# Patient Record
Sex: Male | Born: 1985 | Race: Black or African American | Hispanic: No | Smoking: Current every day smoker
Health system: Southern US, Community
[De-identification: ages and names within clinical notes are randomized; demographics above are authoritative.]

---
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
## 2007-01-04 HISTORY — PX: ORBITAL FRACTURE SURGERY: SHX725

## 2007-08-24 ENCOUNTER — Emergency Department (HOSPITAL_BASED_OUTPATIENT_CLINIC_OR_DEPARTMENT_OTHER): Admission: EM | Admit: 2007-08-24 | Discharge: 2007-08-25 | Payer: Self-pay | Admitting: Emergency Medicine

## 2007-09-14 ENCOUNTER — Ambulatory Visit (HOSPITAL_COMMUNITY): Admission: RE | Admit: 2007-09-14 | Discharge: 2007-09-15 | Payer: Self-pay | Admitting: Otolaryngology
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
## 2010-10-06 LAB — BASIC METABOLIC PANEL
BUN: 7
Chloride: 102
GFR calc non Af Amer: >60
Potassium: 4.9

## 2010-10-06 LAB — CBC
HCT: 50.4
Hemoglobin: 17.3 — ABNORMAL HIGH
MCHC: 34.3
MCV: 94.7
Platelets: 210
RBC: 5.33
RDW: 12.1

## 2019-06-02 ENCOUNTER — Ambulatory Visit (HOSPITAL_COMMUNITY)
Admission: EM | Admit: 2019-06-02 | Discharge: 2019-06-02 | Disposition: A | Discharge status: Home / Self Care | Payer: Self-pay | Attending: Family Medicine | Admitting: Family Medicine

## 2019-06-02 ENCOUNTER — Other Ambulatory Visit: Payer: Self-pay

## 2019-06-02 ENCOUNTER — Encounter (HOSPITAL_COMMUNITY): Payer: Self-pay

## 2019-06-02 DIAGNOSIS — S61412A Laceration without foreign body of left hand, initial encounter: Secondary | ICD-10-CM

## 2019-06-02 MED ORDER — AMOXICILLIN-POT CLAVULANATE 875-125 MG PO TABS: 1.0000 | ORAL_TABLET | Freq: Two times a day (BID) | ORAL | 0 refills | Status: DC

## 2019-06-02 NOTE — Discharge Instructions (Addendum)
We have placed 3 stitches in your hand.  He can come back here in around 10 days for removal. I am going to have him place you on antibiotics to hopefully prevent any infection to occur. Keep the hand clean and dry as possible.  Can wash with normal soap and water just pat dry.  Do not put any ointment on the hand this will delay healing Follow up as needed for continued or worsening symptoms

## 2019-06-02 NOTE — ED Provider Notes (Addendum)
[facility]    CSN: [id number] Arrival date & time: 06/02/19  1407      History   Chief Complaint Chief Complaint  Patient presents with  . Laceration    HPI [name] is a 34 y.o. male.   Patient is a 34 year old male presents today for laceration to left hand.  This occurred last evening.  He also has small superficial laceration and bruising to the left eye.  Reporting was trying to clean of the kitchen and hit eye on the corner of a cabinet.  Denies any trouble with vision, pain in the eye. Small superficial laceration next to larger laceration in between the pointer and middle finger.  Mild swelling generalized around the area.  Normal range of motion.  Normal sensation.  ROS per HPI      History reviewed. No pertinent past medical history.  There are no problems to display for this patient.   History reviewed. No pertinent surgical history.     Home Medications    Prior to Admission medications   Medication Sig Start Date End Date Taking? Authorizing Provider  amoxicillin-clavulanate (AUGMENTIN) 875-125 MG tablet Take 1 tablet by mouth every 12 (twelve) hours. 06/02/19   Orvan July, NP    Family History No family history on file.  Social History Social History   Tobacco Use  . Smoking status: Never Smoker  . Smokeless tobacco: Never Used  Substance Use Topics  . Alcohol use: Not Currently  . Drug use: Not on file     Allergies   Fish allergy   Review of Systems Review of Systems   Physical Exam Triage Vital Signs ED Triage Vitals  Enc Vitals Group     BP 06/02/19 1435 118/67     Pulse Rate 06/02/19 1435 71     Resp 06/02/19 1435 16     Temp 06/02/19 1435 98.7 F (37.1 C)     Temp Source 06/02/19 1435 Oral     SpO2 06/02/19 1435 100 %     Weight --      Height --      Head Circumference --      Peak Flow --      Pain Score 06/02/19 1432 3     Pain Loc --      Pain Edu? --      Excl. in [place]? --    No data  found.  Updated Vital Signs BP 118/67 (BP Location: Left Arm)   Pulse 71   Temp 98.7 F (37.1 C) (Oral)   Resp 16   SpO2 100%   Visual Acuity Right Eye Distance:   Left Eye Distance:   Bilateral Distance:    Right Eye Near:   Left Eye Near:    Bilateral Near:     Physical Exam Vitals and nursing note reviewed.  Constitutional:      Appearance: Normal appearance.  HENT:     Head: Normocephalic and atraumatic.     Nose: Nose normal.  Eyes:     Conjunctiva/sclera: Conjunctivae normal.  Pulmonary:     Effort: Pulmonary effort is normal.  Musculoskeletal:        General: Normal range of motion.     Cervical back: Normal range of motion.  Skin:    General: Skin is warm and dry.     Comments: Approximated 2 similar laceration between left middle and index finger. Able to flex and extend all fingers.  Sensation intact Mild generalized  redness and swelling around the site  Neurological:     Mental Status: He is alert.  Psychiatric:        Mood and Affect: Mood normal.      UC Treatments / Results  Labs (all labs ordered are listed, but only abnormal results are displayed) Labs Reviewed - No data to display  EKG   Radiology No results found.  Procedures Laceration Repair  Date/Time: 06/02/2019 3:22 PM Performed by: Janace Aris, NP Authorized by: Janace Aris, NP   Consent:    Consent obtained:  Verbal   Consent given by:  Patient   Risks discussed:  Infection, need for additional repair, pain, poor cosmetic result and poor wound healing   Alternatives discussed:  No treatment and delayed treatment Universal protocol:    Patient identity confirmed:  Verbally with patient Anesthesia (see MAR for exact dosages):    Anesthesia method:  Local infiltration Repair type:    Repair type:  Simple Exploration:    Hemostasis achieved with:  Direct pressure   Wound exploration: wound explored through full range of motion     Contaminated: no   Treatment:     Area cleansed with:  Soap and water   Amount of cleaning:  Standard   Irrigation solution:  Sterile saline   Irrigation method:  Pressure wash   Visualized foreign bodies/material removed: no   Skin repair:    Repair method:  Sutures   Suture size:  5-0   Suture material:  Nylon   Suture technique:  Simple interrupted   Number of sutures:  3 Approximation:    Approximation:  Close Post-procedure details:    Dressing:  Bulky dressing   Patient tolerance of procedure:  Tolerated well, no immediate complications   (including critical care time)  Medications Ordered in UC Medications - No data to display  Initial Impression / Assessment and Plan / UC Course  I have reviewed the triage vital signs and the nursing notes.  Pertinent labs & imaging results that were available during my care of the patient were reviewed by me and considered in my medical decision making (see chart for details).     Hand laceration Cleaned wound and closed with 3 simple sutures. We will go ahead and prophylactically put on antibiotics based on area of laceration and already progressing redness and swelling. No tendon involvement Recommended follow-up in 10 days for removal No need for tetanus patient reporting tetanus was updated back in December 2020  Follow up as needed for continued or worsening symptoms  Final Clinical Impressions(s) / UC Diagnoses   Final diagnoses:  Laceration of left hand, foreign body presence unspecified, initial encounter     Discharge Instructions     We have placed 3 stitches in your hand.  He can come back here in around 10 days for removal. I am going to have him place you on antibiotics to hopefully prevent any infection to occur. Keep the hand clean and dry as possible.  Can wash with normal soap and water just pat dry.  Do not put any ointment on the hand this will delay healing Follow up as needed for continued or worsening symptoms     ED Prescriptions     Medication Sig Dispense Auth. Provider   amoxicillin-clavulanate (AUGMENTIN) 875-125 MG tablet Take 1 tablet by mouth every 12 (twelve) hours. 14 tablet Kyran Connaughton A, NP     PDMP not reviewed this encounter.   Jaci Lazier, Sherril Shipman A,  NP 06/02/19 1523    Dahlia Byes A, NP 06/02/19 1524

## 2019-06-02 NOTE — ED Triage Notes (Signed)
Patient has a laceration above his left eye and in between his left pointer and middle fingers.

## 2019-10-07 ENCOUNTER — Ambulatory Visit: Payer: Self-pay | Admitting: Critical Care Medicine

## 2019-10-07 ENCOUNTER — Other Ambulatory Visit: Payer: Self-pay

## 2019-10-07 ENCOUNTER — Other Ambulatory Visit (HOSPITAL_COMMUNITY)
Admission: RE | Admit: 2019-10-07 | Discharge: 2019-10-07 | Disposition: A | Discharge status: Home / Self Care | Payer: Self-pay | Source: Ambulatory Visit | Attending: Critical Care Medicine | Admitting: Critical Care Medicine

## 2019-10-07 VITALS — BP 120/71 | HR 64 | Temp 96.3°F | Resp 18 | Ht 65.0 in | Wt 146.0 lb

## 2019-10-07 DIAGNOSIS — R300 Dysuria: Secondary | ICD-10-CM

## 2019-10-07 DIAGNOSIS — Z114 Encounter for screening for human immunodeficiency virus [HIV]: Secondary | ICD-10-CM

## 2019-10-07 DIAGNOSIS — Z113 Encounter for screening for infections with a predominantly sexual mode of transmission: Secondary | ICD-10-CM

## 2019-10-07 DIAGNOSIS — Z1159 Encounter for screening for other viral diseases: Secondary | ICD-10-CM

## 2019-10-07 DIAGNOSIS — B353 Tinea pedis: Secondary | ICD-10-CM

## 2019-10-07 LAB — POCT URINALYSIS DIP (CLINITEK)
Bilirubin, UA: NEGATIVE
Glucose, UA: NEGATIVE mg/dL
Ketones, POC UA: NEGATIVE mg/dL
Leukocytes, UA: NEGATIVE
Nitrite, UA: NEGATIVE
POC PROTEIN,UA: 30 — AB
Spec Grav, UA: 1.025 (ref 1.010–1.025)
Urobilinogen, UA: 0.2 E.U./dL
pH, UA: 8.5 — AB (ref 5.0–8.0)

## 2019-10-07 MED ORDER — TERBINAFINE HCL 1 % EX CREA: 1.0000 "application " | TOPICAL_CREAM | Freq: Two times a day (BID) | CUTANEOUS | 0 refills | Status: DC

## 2019-10-07 NOTE — Progress Notes (Signed)
Patient has not eaten today or taken any medication. Patient complains of discomfort after urinating. Patient denies any discharge, blood or pain while urinating. Patient complains of chronic itching between the 4th and 5th toe on the right foot.

## 2019-10-07 NOTE — Assessment & Plan Note (Addendum)
Dysuria with urinalysis consistent with hematuria that is occult  Will check urine culture  May yet need urology referral

## 2019-10-07 NOTE — Progress Notes (Signed)
Subjective:    Patient ID: [name], male    DOB: 03/15/1985, 34 y.o.   MRN: [id number]  This is a pleasant 34 year old male who comes in today with right foot itching and irritation that is chronic in nature.  He also wishes to have a sexual transmitted disease screen.  He sexually active does not use protection.  Note he was checked for HIV in May of this year when he was in the prison system and was negative he wishes to be checked again.  He does complain of some dysuria.  He denies any blood or discharge from his penis.  He denies any lesions.  He has sex both anally orally and also vaginally with his partner.  He does have occasional nausea.  He has no abdominal pain.  Denies any fever chills or sweats.  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  History reviewed. No pertinent past medical history.   History reviewed. No pertinent family history.   Social History   Socioeconomic History  . Marital status: Single    Spouse name: Not on file  . Number of children: Not on file  . Years of education: Not on file  . Highest education level: Not on file  Occupational History  . Not on file  Tobacco Use  . Smoking status: Never Smoker  . Smokeless tobacco: Never Used  Vaping Use  . Vaping Use: Never used  Substance and Sexual Activity  . Alcohol use: Yes    Comment: monthly- 1/2  . Drug use: Yes    Types: Marijuana  . Sexual activity: Yes  Other Topics Concern  . Not on file  Social History Narrative  . Not on file   Social Determinants of Health   Financial Resource Strain:   . Difficulty of Paying Living Expenses: Not on file  Food Insecurity:   . Worried About Programme researcher, broadcasting/film/video in the Last Year: Not on file  . Ran Out of Food in the Last Year: Not on file   Transportation Needs:   . Lack of Transportation (Medical): Not on file  . Lack of Transportation (Non-Medical): Not on file  Physical Activity:   . Days of Exercise per Week: Not on file  . Minutes of Exercise per Session: Not on file  Stress:   . Feeling of Stress : Not on file  Social Connections:   . Frequency of Communication with Friends and Family: Not on file  . Frequency of Social Gatherings with Friends and Family: Not on file  . Attends Religious Services: Not on file  . Active Member of Clubs or Organizations: Not on file  . Attends Banker Meetings: Not on file  . Marital Status: Not on file  Intimate Partner Violence:   . Fear of Current or Ex-Partner: Not on file  . Emotionally Abused: Not on file  . Physically Abused: Not on file  . Sexually Abused: Not on file     Allergies  Allergen Reactions  . Fish Allergy Hives and Itching     Outpatient Medications Prior to Visit  Medication Sig Dispense Refill  . amoxicillin-clavulanate (AUGMENTIN) 875-125 MG tablet Take 1 tablet by mouth every 12 (twelve) hours. 14 tablet 0  No facility-administered medications prior to visit.      Review of Systems  Constitutional: Negative.   HENT: Negative.   Eyes: Negative.   Respiratory: Negative.   Cardiovascular: Negative.   Gastrointestinal: Positive for nausea. Negative for abdominal distention, abdominal pain, anal bleeding, blood in stool, diarrhea and vomiting.  Endocrine: Negative.   Genitourinary: Positive for dysuria and urgency. Negative for decreased urine volume, difficulty urinating, discharge, enuresis, flank pain, frequency, genital sores, hematuria, penile pain, penile swelling, scrotal swelling and testicular pain.  Musculoskeletal: Negative.   Skin: Negative.   Allergic/Immunologic: Negative.   Neurological: Negative.   Hematological: Negative.   Psychiatric/Behavioral: Negative.        Objective:   Physical Exam Vitals:    10/07/19 1143  BP: 120/71  Pulse: 64  Resp: 18  Temp: (!) 96.3 F (35.7 C)  TempSrc: Oral  SpO2: 98%  Weight: 146 lb (66.2 kg)  Height: 5\' 5"  (1.651 m)    Gen: Pleasant, well-nourished, in no distress,  normal affect  ENT: No lesions,  mouth clear,  oropharynx clear, no postnasal drip, no lesions in the mouth compatible with syphilis  Neck: No JVD, no TMG, no carotid bruits  Lungs: No use of accessory muscles, no dullness to percussion, clear without rales or rhonchi  Cardiovascular: RRR, heart sounds normal, no murmur or gallops, no peripheral edema  Abdomen: soft and NT, no HSM,  BS normal  Musculoskeletal: No deformities, no cyanosis or clubbing  Genitourinary: No findings of syphilis in the penile area.  Note there is a cyst on the spermatic cord of the left testicle that is nontender, the testicles are not abnormal  The rectum does not show any lesions compatible with syphilis  Neuro: alert, non focal  Skin: Warm, evidence of tinea pedis in between the fourth and fifth right toe and fourth toe on the left No skin findings compatible with syphilis        Assessment & Plan:  I personally reviewed all images and lab data in the [facility] system as well as any outside material available during this office visit and agree with the  radiology impressions.   Dysuria Dysuria with urinalysis consistent with hematuria that is occult  Will check urine culture  May yet need urology referral  Routine screening for STI (sexually transmitted infection) Patient needing STI screening we will check HIV, RPR with reflex to treponema, hepatitis B and C, urine cytology to check for gonorrhea chlamydia trichomonas HPV    Tinea pedis Bilateral tinea pedis have given the patient prescription for 1% terbinafine cream to apply twice daily to affected areas in the feet for 1 week or until the 30 g tube is gone  I also asked the patient to use a good foot moisturizer   [name] was seen  today for exposure to std and recurrent skin infections.  Diagnoses and all orders for this visit:  Routine screening for STI (sexually transmitted infection) -     HIV antibody (with reflex) -     Hep B Core Ab W/Reflex -     Cytology - Non PAP; -     HCV Ab w/Rflx to Verification -     RPR w/reflex to TrepSure  Dysuria -     POCT URINALYSIS DIP (CLINITEK) -     Urine Culture  Encounter for screening for HIV -     HIV antibody (with reflex)  Need for hepatitis B screening test -     Hep B  Core Ab W/Reflex  Need for hepatitis C screening test -     HCV Ab w/Rflx to Verification  Tinea pedis of both feet  Other orders -     terbinafine (LAMISIL) 1 % cream; Apply 1 application topically 2 (two) times daily.   We will obtain for this patient primary care access at primary care counseling with Dr. Earlene Plater for follow-up

## 2019-10-07 NOTE — Assessment & Plan Note (Signed)
Bilateral tinea pedis have given the patient prescription for 1% terbinafine cream to apply twice daily to affected areas in the feet for 1 week or until the 30 g tube is gone  I also asked the patient to use a good foot moisturizer

## 2019-10-07 NOTE — Patient Instructions (Addendum)
Start terbinafine (lamisil) 1% cream , apply twice daily to affected areas between toes for one week or when tube is gone  Please obtain a good foot moisturizer to apply daily after you shower  Complete sexually transmitted labs were obtained  A urine culture will be obtained  A follow up appointment to establish with primary care with Dr Marcy Siren will be obtained   Athlete's Foot  Athlete's foot (tinea pedis) is a fungal infection of the skin on your feet. It often occurs on the skin that is between or underneath your toes. It can also occur on the soles of your feet. Symptoms include itchy or white and flaky areas on the skin. The infection can spread from person to person (is contagious). It can also spread when a person's bare feet come in contact with the fungus on shower floors or on items such as shoes. Follow these instructions at home: Medicines  Apply or take over-the-counter and prescription medicines only as told by your doctor.  Apply your antifungal medicine as told by your doctor. Do not stop using the medicine even if your feet start to get better. Foot care  Do not scratch your feet.  Keep your feet dry: ? Wear cotton or wool socks. Change your socks every day or if they become wet. ? Wear shoes that allow air to move around, such as sandals or canvas tennis shoes.  Wash and dry your feet: ? Every day or as told by your doctor. ? After exercising. ? Including the area between your toes. General instructions  Do not share any of these items that touch your feet: ? Towels. ? Shoes. ? Nail clippers. ? Other personal items.  Protect your feet by wearing sandals in wet areas, such as locker rooms and shared showers.  Keep all follow-up visits as told by your doctor. This is important.  If you have diabetes, keep your blood sugar under control. Contact a doctor if:  You have a fever.  You have swelling, pain, warmth, or redness in your  foot.  Your feet are not getting better with treatment.  Your symptoms get worse.  You have new symptoms. Summary  Athlete's foot is a fungal infection of the skin on your feet.  Symptoms include itchy or white and flaky areas on the skin.  Apply your antifungal medicine as told by your doctor.  Keep your feet clean and dry. This information is not intended to replace advice given to you by your health care provider. Make sure you discuss any questions you have with your health care provider. Document Revised: 12/15/2016 Document Reviewed: 10/10/2016 Elsevier Patient Education  2020 [employer].

## 2019-10-07 NOTE — Assessment & Plan Note (Signed)
Patient needing STI screening we will check HIV, RPR with reflex to treponema, hepatitis B and C, urine cytology to check for gonorrhea chlamydia trichomonas HPV

## 2019-10-08 LAB — URINE CYTOLOGY ANCILLARY ONLY
Chlamydia: NEGATIVE
Comment: NEGATIVE
Comment: NORMAL
Neisseria Gonorrhea: NEGATIVE

## 2019-10-08 LAB — HCV INTERPRETATION

## 2019-10-08 LAB — RPR W/REFLEX TO TREPSURE: RPR: REACTIVE — AB

## 2019-10-08 LAB — RPR, QUANT: RPR, Quant: 1:1 {titer} — ABNORMAL HIGH

## 2019-10-08 LAB — HEPATITIS B CORE AB W/REFLEX: Hep B Core Total Ab: NEGATIVE

## 2019-10-08 LAB — HCV AB W/RFLX TO VERIFICATION: HCV Ab: <0.1 s/co ratio (ref 0.0–0.9)

## 2019-10-08 LAB — HIV ANTIBODY (ROUTINE TESTING W REFLEX): HIV Screen 4th Generation wRfx: NONREACTIVE

## 2019-10-09 ENCOUNTER — Telehealth: Payer: Self-pay | Admitting: *Deleted

## 2019-10-09 ENCOUNTER — Telehealth: Payer: Self-pay | Admitting: Critical Care Medicine

## 2019-10-09 NOTE — Telephone Encounter (Signed)
Medical Assistant left message on patient's home and cell voicemail. Voicemail states to give a call back to Jahaziel Francois with [facility] at [phone]. 

## 2019-10-09 NOTE — Telephone Encounter (Signed)
.  Medical Assistant left message on patient's home and cell voicemail. Voicemail states to give a call back to Lille Karim with [facility] [phone] 

## 2019-10-09 NOTE — Telephone Encounter (Signed)
-----   Message from Storm Frisk, MD sent at 10/08/2019  2:27 PM EDT ----- Let mr Marcelline Deist know his syphilis test is showing prior exposure but it is not active at this time.  I do recommend he use protection for sex with a condom

## 2019-10-11 LAB — URINE CULTURE

## 2020-01-21 ENCOUNTER — Telehealth: Payer: Self-pay | Admitting: Internal Medicine

## 2020-02-14 ENCOUNTER — Encounter (HOSPITAL_COMMUNITY): Payer: Self-pay

## 2020-02-14 ENCOUNTER — Ambulatory Visit (HOSPITAL_COMMUNITY)
Admission: EM | Admit: 2020-02-14 | Discharge: 2020-02-14 | Disposition: A | Discharge status: Home / Self Care | Payer: Self-pay | Attending: Student | Admitting: Student

## 2020-02-14 ENCOUNTER — Other Ambulatory Visit: Payer: Self-pay

## 2020-02-14 DIAGNOSIS — Z202 Contact with and (suspected) exposure to infections with a predominantly sexual mode of transmission: Secondary | ICD-10-CM

## 2020-02-14 MED ORDER — METRONIDAZOLE 500 MG PO TABS: 2000.0000 mg | ORAL_TABLET | Freq: Once | ORAL | 0 refills | Status: AC

## 2020-02-14 NOTE — Discharge Instructions (Addendum)
-  Take the antibiotic- Flagyl (metronidazole). Take all four pills at the time time today.  -This is the treatment for Trichomonas. We'll also test for chlamydia and gonorrhea today, and call you if those labs are positive. We can send additional treatment if necessary.  -Please abstain from intercourse from about 7 days.  -Come back and see [place] if you develop back pain, abdominal pain, new fevers/chills, worsening of symptoms despite treatment, etc.

## 2020-02-14 NOTE — ED Provider Notes (Signed)
[facility]    CSN: [id number] Arrival date & time: 02/14/20  0930      History   Chief Complaint Chief Complaint  Patient presents with  . Exposure to STD    HPI [name] is a 35 y.o. male presenting following exposure to STD. History dysuria, tinea pedis. Today presenting with exposure to trichomonas and now with "irritation" inside the tip of his urethra. States he and his girlfriend had sexual encounter with new male partner. 1 day ago girlfriend tested positive for trichomonas. Denies hematuria, dysuria, frequency, urgency, back pain, n/v/d/abd pain, fevers/chills, abdnormal penile discharge.    HPI  History reviewed. No pertinent past medical history.  Patient Active Problem List   Diagnosis Date Noted  . Dysuria 10/07/2019  . Routine screening for STI (sexually transmitted infection) 10/07/2019  . Tinea pedis 10/07/2019    History reviewed. No pertinent surgical history.     Home Medications    Prior to Admission medications   Medication Sig Start Date End Date Taking? Authorizing Provider  metroNIDAZOLE (FLAGYL) 500 MG tablet Take 4 tablets (2,000 mg total) by mouth once for 1 dose. 02/14/20 02/14/20 Yes Rhys Martini, PA-C  terbinafine (LAMISIL) 1 % cream Apply 1 application topically 2 (two) times daily. 10/07/19   Storm Frisk, MD    Family History History reviewed. No pertinent family history.  Social History Social History   Tobacco Use  . Smoking status: Never Smoker  . Smokeless tobacco: Never Used  Vaping Use  . Vaping Use: Never used  Substance Use Topics  . Alcohol use: Yes    Comment: monthly- 1/2  . Drug use: Yes    Types: Marijuana     Allergies   Fish allergy   Review of Systems Review of Systems  Constitutional: Negative for chills and fever.  HENT: Negative for sore throat.   Eyes: Negative for pain and redness.  Respiratory: Negative for shortness of breath.   Cardiovascular: Negative for chest  pain.  Gastrointestinal: Negative for abdominal pain, diarrhea, nausea and vomiting.  Genitourinary: Negative for decreased urine volume, difficulty urinating, dysuria, flank pain, frequency, genital sores, hematuria, penile discharge, penile pain, penile swelling, scrotal swelling, testicular pain and urgency.       Feeling of irritation in tip of urethra  Musculoskeletal: Negative for back pain.  Skin: Negative for rash.     Physical Exam Triage Vital Signs ED Triage Vitals  Enc Vitals Group     BP 02/14/20 0949 127/71     Pulse Rate 02/14/20 0949 75     Resp 02/14/20 0949 17     Temp 02/14/20 0949 98.6 F (37 C)     Temp Source 02/14/20 0949 Oral     SpO2 02/14/20 0949 100 %     Weight --      Height --      Head Circumference --      Peak Flow --      Pain Score 02/14/20 0948 0     Pain Loc --      Pain Edu? --      Excl. in GC? --    No data found.  Updated Vital Signs BP 127/71 (BP Location: Left Arm)   Pulse 75   Temp 98.6 F (37 C) (Oral)   Resp 17   SpO2 100%   Visual Acuity Right Eye Distance:   Left Eye Distance:   Bilateral Distance:    Right Eye Near:  Left Eye Near:    Bilateral Near:     Physical Exam Vitals reviewed.  Constitutional:      General: He is not in acute distress.    Appearance: Normal appearance. He is not ill-appearing.  HENT:     Head: Normocephalic and atraumatic.  Cardiovascular:     Rate and Rhythm: Normal rate and regular rhythm.     Heart sounds: Normal heart sounds.  Pulmonary:     Effort: Pulmonary effort is normal.     Breath sounds: Normal breath sounds.  Abdominal:     General: Bowel sounds are normal. There is no distension.     Palpations: Abdomen is soft. There is no mass.     Tenderness: There is no abdominal tenderness. There is no right CVA tenderness, left CVA tenderness, guarding or rebound.  Neurological:     General: No focal deficit present.     Mental Status: He is alert and oriented to person,  place, and time. Mental status is at baseline.  Psychiatric:        Mood and Affect: Mood normal.        Behavior: Behavior normal.        Thought Content: Thought content normal.        Judgment: Judgment normal.      UC Treatments / Results  Labs (all labs ordered are listed, but only abnormal results are displayed) Labs Reviewed  CYTOLOGY, (ORAL, ANAL, URETHRAL) ANCILLARY ONLY    EKG   Radiology No results found.  Procedures Procedures (including critical care time)  Medications Ordered in UC Medications - No data to display  Initial Impression / Assessment and Plan / UC Course  I have reviewed the triage vital signs and the nursing notes.  Pertinent labs & imaging results that were available during my care of the patient were reviewed by me and considered in my medical decision making (see chart for details).     Exposure to trichomonas now with penile irritation. Plan to treat for trichomonas with flagyl as below. Avoid alcohol while taking this medication.  Will send for G/C, trich. Declines HIV, RPR.   We have sent testing for sexually transmitted infections. We will notify you of any positive results once they are received. If required, we will prescribe any medications you might need.   Please refrain from all sexual activity for at least the next seven days.  Seek additional medical attention if you develop fevers/chills, new/worsening abdominal pain, new/worsening vaginal discomfort/discharge, etc. Patient verbalizes understanding and agreement.  Spent over 30 minutes obtaining H&P, performing physical, discussing results, treatment plan and plan for follow-up with patient. Patient agrees with plan.   This chart was dictated using voice recognition software, Dragon. Despite the best efforts of this provider to proofread and correct errors, errors may still occur which can change documentation meaning.   Final Clinical Impressions(s) / UC Diagnoses    Final diagnoses:  STD exposure  Trichomonas exposure     Discharge Instructions     -Take the antibiotic- Flagyl (metronidazole). Take all four pills at the time time today.  -This is the treatment for Trichomonas. We'll also test for chlamydia and gonorrhea today, and call you if those labs are positive. We can send additional treatment if necessary.  -Please abstain from intercourse from about 7 days.  -Come back and see [place] if you develop back pain, abdominal pain, new fevers/chills, worsening of symptoms despite treatment, etc.    ED Prescriptions  Medication Sig Dispense Auth. Provider   metroNIDAZOLE (FLAGYL) 500 MG tablet Take 4 tablets (2,000 mg total) by mouth once for 1 dose. 4 tablet Rhys Martini, PA-C     PDMP not reviewed this encounter.   Rhys Martini, PA-C 02/14/20 1023

## 2020-02-14 NOTE — ED Triage Notes (Signed)
Pt presents with penile discomfort and irritation x 4 days. Pt states he has been exposed to his partner who has Trichomoniasis.

## 2020-02-17 LAB — CYTOLOGY, (ORAL, ANAL, URETHRAL) ANCILLARY ONLY
Chlamydia: NEGATIVE
Comment: NEGATIVE
Comment: NEGATIVE
Comment: NORMAL
Neisseria Gonorrhea: NEGATIVE
Trichomonas: POSITIVE — AB

## 2020-07-07 ENCOUNTER — Other Ambulatory Visit: Payer: Self-pay

## 2020-07-07 ENCOUNTER — Encounter (HOSPITAL_COMMUNITY): Payer: Self-pay | Admitting: Emergency Medicine

## 2020-07-07 ENCOUNTER — Ambulatory Visit (HOSPITAL_COMMUNITY)
Admission: EM | Admit: 2020-07-07 | Discharge: 2020-07-07 | Disposition: A | Discharge status: Home / Self Care | Payer: Self-pay | Attending: Emergency Medicine | Admitting: Emergency Medicine

## 2020-07-07 DIAGNOSIS — S41112A Laceration without foreign body of left upper arm, initial encounter: Secondary | ICD-10-CM | POA: Insufficient documentation

## 2020-07-07 DIAGNOSIS — R61 Generalized hyperhidrosis: Secondary | ICD-10-CM | POA: Insufficient documentation

## 2020-07-07 DIAGNOSIS — Z113 Encounter for screening for infections with a predominantly sexual mode of transmission: Secondary | ICD-10-CM | POA: Insufficient documentation

## 2020-07-07 LAB — HIV ANTIBODY (ROUTINE TESTING W REFLEX): HIV Screen 4th Generation wRfx: NONREACTIVE

## 2020-07-07 MED ORDER — DOXYCYCLINE HYCLATE 100 MG PO CAPS: 100.0000 mg | ORAL_CAPSULE | Freq: Two times a day (BID) | ORAL | 0 refills | Status: DC

## 2020-07-07 NOTE — ED Triage Notes (Signed)
Pt presents with left arm laceration xs 4 days. States cut on metal plate. States had Tdap in 2017.   Pt also c/o of sweating and chills at night xs 1-2 years.   Pt also would like STD testing. Denies any symptoms and unknown to exposures.

## 2020-07-07 NOTE — Discharge Instructions (Addendum)
Take the doxycycline one pill twice a day for the next 10 days.   Clean the wound with warm soapy water at least twice a day and cover the laceration especially when out at construction sites.   Return for re-evaluation if you develop any worsening redness, swelling, or fevers.    Try to make sure that you are sleeping in a cool room to help prevent night sweats.  If this continues to be a problem, you should follow up with primary care for further evaluation.    We will contact you with the results from your lab work and any additional treatment.   Do not have sex while taking undergoing treatment for STI.  Make sure that all of your partners get tested and treated.  Use a condom or other barrier method for all sexual encounters.    Return or go to the Emergency Department if symptoms worsen or do not improve in the next few days.

## 2020-07-07 NOTE — ED Provider Notes (Signed)
[facility]    CSN: [id number] Arrival date & time: 07/07/20  1537      History   Chief Complaint Chief Complaint  Patient presents with   Extremity Laceration    HPI [name] is a 35 y.o. male.   Patient here for evaluation of left arm/wrist laceration.  Reports getting cut on a metal plate about 4 days ago.  Reports cleaning wound with soap and water as well as alcohol.  Reports noticing some swelling and redness that started today.  Last Tdap within the past 5 years. Also reports having some night sweats that have been ongoing for the past several years, but worse recently.  Lastly, patient is requesting STD testing.  Denies any recent known encounters with positive patients but reports recently going through a bad break up.  Denies any trauma, injury, or other precipitating event.  Denies any specific alleviating or aggravating factors.  Denies any fevers, chest pain, shortness of breath, N/V/D, numbness, tingling, weakness, abdominal pain, or headaches.     The history is provided by the patient.   History reviewed. No pertinent past medical history.  Patient Active Problem List   Diagnosis Date Noted   Dysuria 10/07/2019   Routine screening for STI (sexually transmitted infection) 10/07/2019   Tinea pedis 10/07/2019    History reviewed. No pertinent surgical history.     Home Medications    Prior to Admission medications   Medication Sig Start Date End Date Taking? Authorizing Provider  doxycycline (VIBRAMYCIN) 100 MG capsule Take 1 capsule (100 mg total) by mouth 2 (two) times daily. 07/07/20  Yes Ivette Loyal, NP  terbinafine (LAMISIL) 1 % cream Apply 1 application topically 2 (two) times daily. 10/07/19   Storm Frisk, MD    Family History History reviewed. No pertinent family history.  Social History Social History   Tobacco Use   Smoking status: Never   Smokeless tobacco: Never  Vaping Use   Vaping Use: Never used  Substance  Use Topics   Alcohol use: Yes    Comment: monthly- 1/2   Drug use: Yes    Types: Marijuana     Allergies   Fish allergy   Review of Systems Review of Systems  Constitutional:  Positive for chills and diaphoresis.  Genitourinary:  Negative for penile discharge, penile pain, penile swelling, scrotal swelling and testicular pain.  Skin:  Positive for wound.  All other systems reviewed and are negative.   Physical Exam Triage Vital Signs ED Triage Vitals  Enc Vitals Group     BP 07/07/20 1609 99/63     Pulse Rate 07/07/20 1609 70     Resp 07/07/20 1609 16     Temp 07/07/20 1609 99.2 F (37.3 C)     Temp Source 07/07/20 1609 Oral     SpO2 07/07/20 1609 97 %     Weight --      Height --      Head Circumference --      Peak Flow --      Pain Score 07/07/20 1607 0     Pain Loc --      Pain Edu? --      Excl. in GC? --    No data found.  Updated Vital Signs BP 99/63 (BP Location: Right Arm)   Pulse 70   Temp 99.2 F (37.3 C) (Oral)   Resp 16   SpO2 97%   Visual Acuity Right Eye Distance:  Left Eye Distance:   Bilateral Distance:    Right Eye Near:   Left Eye Near:    Bilateral Near:     Physical Exam Vitals and nursing note reviewed.  Constitutional:      General: He is not in acute distress.    Appearance: Normal appearance. He is not ill-appearing, toxic-appearing or diaphoretic.  HENT:     Head: Normocephalic and atraumatic.  Eyes:     Conjunctiva/sclera: Conjunctivae normal.  Cardiovascular:     Rate and Rhythm: Normal rate.     Pulses: Normal pulses.  Pulmonary:     Effort: Pulmonary effort is normal.  Abdominal:     General: Abdomen is flat.  Musculoskeletal:        General: Normal range of motion.     Cervical back: Normal range of motion.  Skin:    General: Skin is warm and dry.     Findings: Laceration (some redness and swelling surrounding laceration with some purulent drainage in the wound, see photo below) present.  Neurological:      General: No focal deficit present.     Mental Status: He is alert and oriented to person, place, and time.  Psychiatric:        Mood and Affect: Mood normal.      UC Treatments / Results  Labs (all labs ordered are listed, but only abnormal results are displayed) Labs Reviewed  RPR  HIV ANTIBODY (ROUTINE TESTING W REFLEX)  CYTOLOGY, (ORAL, ANAL, URETHRAL) ANCILLARY ONLY    EKG   Radiology No results found.  Procedures Procedures (including critical care time)  Medications Ordered in UC Medications - No data to display  Initial Impression / Assessment and Plan / UC Course  I have reviewed the triage vital signs and the nursing notes.  Pertinent labs & imaging results that were available during my care of the patient were reviewed by me and considered in my medical decision making (see chart for details).    Assessment negative for red flags or concerns.  Laceration of left upper extremity.  Concern for possible infection.  Will treat with doxycycline twice a day for the next 10 days.  Instructed patient to clean wound with warm soapy water at least twice a day and cover wound with a clean bandage.  Strict ED follow up for any worsening signs of infection.   Screen for STD. Self swab obtained as well as labs for HIV and RPR, will treat based on results.  Discussed safe sex practices including condom or other barrier method use.   Night sweats.  As this has been ongoing for the past few years, unlikely to be related to any infectious process.  Discussed ensuring that room temperature is cool and avoiding heavy coverings to try and prevent night sweats.  Recommend following up with primary care as needed for further evaluation if night sweats continue.   Final Clinical Impressions(s) / UC Diagnoses   Final diagnoses:  Laceration of left upper extremity, initial encounter  Screen for STD (sexually transmitted disease)  Night sweats     Discharge Instructions      Take  the doxycycline one pill twice a day for the next 10 days.   Clean the wound with warm soapy water at least twice a day and cover the laceration especially when out at construction sites.   Return for re-evaluation if you develop any worsening redness, swelling, or fevers.    Try to make sure that you are sleeping  in a cool room to help prevent night sweats.  If this continues to be a problem, you should follow up with primary care for further evaluation.    We will contact you with the results from your lab work and any additional treatment.   Do not have sex while taking undergoing treatment for STI.  Make sure that all of your partners get tested and treated.  Use a condom or other barrier method for all sexual encounters.    Return or go to the Emergency Department if symptoms worsen or do not improve in the next few days.       ED Prescriptions     Medication Sig Dispense Auth. Provider   doxycycline (VIBRAMYCIN) 100 MG capsule Take 1 capsule (100 mg total) by mouth 2 (two) times daily. 20 capsule Ivette Loyal, NP      PDMP not reviewed this encounter.   Ivette Loyal, NP 07/07/20 [phone]

## 2020-07-08 LAB — CYTOLOGY, (ORAL, ANAL, URETHRAL) ANCILLARY ONLY
Chlamydia: NEGATIVE
Comment: NEGATIVE
Comment: NEGATIVE
Comment: NORMAL
Neisseria Gonorrhea: NEGATIVE
Trichomonas: NEGATIVE

## 2020-07-08 LAB — RPR
RPR Ser Ql: REACTIVE — AB
RPR Titer: 1:1 {titer}

## 2020-07-09 LAB — T.PALLIDUM AB, TOTAL: T Pallidum Abs: REACTIVE — AB

## 2020-07-10 ENCOUNTER — Other Ambulatory Visit: Payer: Self-pay

## 2020-07-10 ENCOUNTER — Ambulatory Visit (INDEPENDENT_AMBULATORY_CARE_PROVIDER_SITE_OTHER): Payer: Self-pay

## 2020-07-10 ENCOUNTER — Encounter (HOSPITAL_COMMUNITY): Payer: Self-pay | Admitting: Emergency Medicine

## 2020-07-10 ENCOUNTER — Ambulatory Visit (HOSPITAL_COMMUNITY)
Admission: EM | Admit: 2020-07-10 | Discharge: 2020-07-10 | Disposition: A | Discharge status: Home / Self Care | Payer: Self-pay | Attending: Internal Medicine | Admitting: Internal Medicine

## 2020-07-10 DIAGNOSIS — R0602 Shortness of breath: Secondary | ICD-10-CM

## 2020-07-10 DIAGNOSIS — R61 Generalized hyperhidrosis: Secondary | ICD-10-CM

## 2020-07-10 DIAGNOSIS — S41112A Laceration without foreign body of left upper arm, initial encounter: Secondary | ICD-10-CM

## 2020-07-10 DIAGNOSIS — R0789 Other chest pain: Secondary | ICD-10-CM

## 2020-07-10 MED ORDER — TIZANIDINE HCL 4 MG PO TABS: 4.0000 mg | ORAL_TABLET | Freq: Four times a day (QID) | ORAL | 0 refills | Status: DC | PRN

## 2020-07-10 NOTE — Discharge Instructions (Addendum)
Take the Zanaflex every 6 hours as needed for muscle pain and spasms.  Do not take this before driving as it can make you sleepy.  Take Tylenol and/or ibuprofen as needed for pain relief and fever reduction. Make sure you are drinking plenty of fluids, especially water, and rest.  Pick up the prescription for the antibiotics and take that as prescribed.  For symptoms do not improve or if you develop any difficulty breathing, please go to the emergency room for reevaluation.

## 2020-07-10 NOTE — ED Notes (Signed)
EKG obtained and given to Chales Salmon, NP

## 2020-07-10 NOTE — ED Provider Notes (Signed)
[facility]    CSN: [id number] Arrival date & time: 07/10/20  1208      History   Chief Complaint Chief Complaint  Patient presents with   Chest Pain   Laceration    HPI [name] is a 35 y.o. male.   Patient here for evaluation of right rib pain and significant diaphoresis that started yesterday.  Reports pain in right side and radiates to the back, worse with deep inhalation.  Describes pain as sharp and stabbing.  Reports having history of diaphoresis especially at night but sweating has been worse today.  Denies any known fevers.  Also reports wrist laceration has increased redness and swelling.  Reports being unable to pick up prescriptions prior to today.  Denies any fevers, chest pain, N/V/D, numbness, tingling, weakness, abdominal pain, or headaches.     The history is provided by the patient.  Chest Pain Associated symptoms: diaphoresis   Associated symptoms: no fever   Laceration Associated symptoms: no fever    History reviewed. No pertinent past medical history.  Patient Active Problem List   Diagnosis Date Noted   Dysuria 10/07/2019   Routine screening for STI (sexually transmitted infection) 10/07/2019   Tinea pedis 10/07/2019    Past Surgical History:  Procedure Laterality Date   ORBITAL FRACTURE SURGERY  2009       Home Medications    Prior to Admission medications   Medication Sig Start Date End Date Taking? Authorizing Provider  tiZANidine (ZANAFLEX) 4 MG tablet Take 1 tablet (4 mg total) by mouth every 6 (six) hours as needed for muscle spasms. 07/10/20  Yes Ivette Loyal, NP  doxycycline (VIBRAMYCIN) 100 MG capsule Take 1 capsule (100 mg total) by mouth 2 (two) times daily. 07/07/20   Ivette Loyal, NP  terbinafine (LAMISIL) 1 % cream Apply 1 application topically 2 (two) times daily. 10/07/19   Storm Frisk, MD    Family History History reviewed. No pertinent family history.  Social History Social History    Tobacco Use   Smoking status: Never   Smokeless tobacco: Never  Vaping Use   Vaping Use: Never used  Substance Use Topics   Alcohol use: Yes    Comment: monthly- 1/2   Drug use: Yes    Types: Marijuana     Allergies   Fish allergy   Review of Systems Review of Systems  Constitutional:  Positive for diaphoresis. Negative for fever.  Respiratory:  Positive for chest tightness.   Cardiovascular:  Positive for chest pain.  Skin:  Positive for wound.  All other systems reviewed and are negative.   Physical Exam Triage Vital Signs ED Triage Vitals  Enc Vitals Group     BP 07/10/20 1219 123/66     Pulse Rate 07/10/20 1219 66     Resp 07/10/20 1219 16     Temp 07/10/20 1219 98.7 F (37.1 C)     Temp Source 07/10/20 1219 Oral     SpO2 07/10/20 1219 98 %     Weight --      Height --      Head Circumference --      Peak Flow --      Pain Score 07/10/20 1223 9     Pain Loc --      Pain Edu? --      Excl. in GC? --    No data found.  Updated Vital Signs BP 123/66 (BP Location: Left Arm)  Pulse 66   Temp 98.7 F (37.1 C) (Oral)   Resp 16   SpO2 98%   Visual Acuity Right Eye Distance:   Left Eye Distance:   Bilateral Distance:    Right Eye Near:   Left Eye Near:    Bilateral Near:     Physical Exam Vitals and nursing note reviewed.  Constitutional:      General: He is not in acute distress.    Appearance: Normal appearance. He is not ill-appearing, toxic-appearing or diaphoretic.  HENT:     Head: Normocephalic and atraumatic.  Eyes:     Conjunctiva/sclera: Conjunctivae normal.  Cardiovascular:     Rate and Rhythm: Normal rate and regular rhythm.     Pulses: Normal pulses.     Heart sounds: Normal heart sounds.  Pulmonary:     Effort: Pulmonary effort is normal.     Breath sounds: Normal breath sounds.  Chest:     Chest wall: Tenderness (right sided rib tenderness) present. No edema.  Abdominal:     General: Abdomen is flat.  Musculoskeletal:         General: Normal range of motion.     Cervical back: Normal range of motion.  Skin:    General: Skin is warm and dry.     Findings: Wound (laceration to left wrist with redness and swelling, but does not look significantly worse) present.  Neurological:     General: No focal deficit present.     Mental Status: He is alert and oriented to person, place, and time.  Psychiatric:        Mood and Affect: Mood normal.     UC Treatments / Results  Labs (all labs ordered are listed, but only abnormal results are displayed) Labs Reviewed - No data to display  EKG   Radiology DG Chest 2 View  Result Date: 07/10/2020 CLINICAL DATA:  Shortness of breath. EXAM: CHEST - 2 VIEW COMPARISON:  None. FINDINGS: The heart size and mediastinal contours are within normal limits. Both lungs are clear. The visualized skeletal structures are unremarkable. IMPRESSION: No active cardiopulmonary disease. Electronically Signed   By: Lupita Raider M.D.   On: 07/10/2020 13:25    Procedures Procedures (including critical care time)  Medications Ordered in UC Medications - No data to display  Initial Impression / Assessment and Plan / UC Course  I have reviewed the triage vital signs and the nursing notes.  Pertinent labs & imaging results that were available during my care of the patient were reviewed by me and considered in my medical decision making (see chart for details).    Assessment negative for red flags or concerns.  X-ray with no active cardiopulmonary disease.  EKG shows sinus rhythm with normal rhythm and rate.  Chest wall pain versus muscle pain/spasms.  We will treat with Zanaflex every 6 hours as needed.  Instructed not to take this medication prior to driving as it can cause drowsiness.  May take Tylenol and/or ibuprofen as needed for pain and fevers.  Encourage fluids and rest.  Patient instructed to take antibiotics previously prescribed for infected laceration to the left wrist.   Patient instructed to go to the emergency room for any worsening symptoms such as difficulty breathing.  Follow-up with primary care as needed. Final Clinical Impressions(s) / UC Diagnoses   Final diagnoses:  Chest wall pain  Diaphoresis  Laceration of left upper arm, initial encounter     Discharge Instructions  Take the Zanaflex every 6 hours as needed for muscle pain and spasms.  Do not take this before driving as it can make you sleepy.  Take Tylenol and/or ibuprofen as needed for pain relief and fever reduction. Make sure you are drinking plenty of fluids, especially water, and rest.  Pick up the prescription for the antibiotics and take that as prescribed.  For symptoms do not improve or if you develop any difficulty breathing, please go to the emergency room for reevaluation.      ED Prescriptions     Medication Sig Dispense Auth. Provider   tiZANidine (ZANAFLEX) 4 MG tablet Take 1 tablet (4 mg total) by mouth every 6 (six) hours as needed for muscle spasms. 20 tablet Ivette Loyal, NP      PDMP not reviewed this encounter.   Ivette Loyal, NP 07/10/20 1346

## 2020-07-10 NOTE — ED Triage Notes (Addendum)
Patient c/o right rib beginning last night around 9 that increases with movement, says it makes it hard to breathe. Says he was seen within the last week for a laceration and has been unable to pick up antibiotics. Currently having chills, is visibly sweating. States he took tylenol around 9 am today.

## 2020-07-20 ENCOUNTER — Inpatient Hospital Stay (HOSPITAL_COMMUNITY)
Admission: EM | Admit: 2020-07-20 | Discharge: 2020-08-03 | DRG: 511 | Disposition: A | Discharge status: Home / Self Care | Payer: Self-pay | Attending: Student | Admitting: Student

## 2020-07-20 ENCOUNTER — Emergency Department (HOSPITAL_COMMUNITY): Payer: Self-pay

## 2020-07-20 ENCOUNTER — Other Ambulatory Visit: Payer: Self-pay

## 2020-07-20 DIAGNOSIS — Z833 Family history of diabetes mellitus: Secondary | ICD-10-CM

## 2020-07-20 DIAGNOSIS — I10 Essential (primary) hypertension: Secondary | ICD-10-CM | POA: Diagnosis present

## 2020-07-20 DIAGNOSIS — M62838 Other muscle spasm: Secondary | ICD-10-CM | POA: Diagnosis not present

## 2020-07-20 DIAGNOSIS — J90 Pleural effusion, not elsewhere classified: Secondary | ICD-10-CM | POA: Diagnosis present

## 2020-07-20 DIAGNOSIS — Z20822 Contact with and (suspected) exposure to covid-19: Secondary | ICD-10-CM | POA: Diagnosis present

## 2020-07-20 DIAGNOSIS — R7989 Other specified abnormal findings of blood chemistry: Secondary | ICD-10-CM | POA: Diagnosis present

## 2020-07-20 DIAGNOSIS — M009 Pyogenic arthritis, unspecified: Secondary | ICD-10-CM | POA: Diagnosis present

## 2020-07-20 DIAGNOSIS — R079 Chest pain, unspecified: Secondary | ICD-10-CM

## 2020-07-20 DIAGNOSIS — F17210 Nicotine dependence, cigarettes, uncomplicated: Secondary | ICD-10-CM | POA: Diagnosis present

## 2020-07-20 DIAGNOSIS — D72829 Elevated white blood cell count, unspecified: Secondary | ICD-10-CM

## 2020-07-20 DIAGNOSIS — M00222 Other streptococcal arthritis, left elbow: Principal | ICD-10-CM | POA: Diagnosis present

## 2020-07-20 DIAGNOSIS — R509 Fever, unspecified: Secondary | ICD-10-CM

## 2020-07-20 DIAGNOSIS — R0789 Other chest pain: Secondary | ICD-10-CM | POA: Diagnosis present

## 2020-07-20 DIAGNOSIS — M25522 Pain in left elbow: Secondary | ICD-10-CM

## 2020-07-20 DIAGNOSIS — Z5900 Homelessness unspecified: Secondary | ICD-10-CM

## 2020-07-20 DIAGNOSIS — B950 Streptococcus, group A, as the cause of diseases classified elsewhere: Secondary | ICD-10-CM | POA: Diagnosis present

## 2020-07-20 NOTE — ED Triage Notes (Signed)
Patient arrives complaining of right flank pain after an injury at work that he's been evaluated for. Patient states pain is worse. Patient also woke up and noted his elbow was swollen and warm to the touch. Patient noted to be febrile. No injury to elbow.

## 2020-07-20 NOTE — ED Notes (Signed)
2 sets of blood cultures sent down to lab

## 2020-07-21 ENCOUNTER — Encounter (HOSPITAL_COMMUNITY)
Admission: EM | Disposition: A | Discharge status: Home / Self Care | Payer: Self-pay | Source: Home / Self Care | Attending: Internal Medicine

## 2020-07-21 ENCOUNTER — Encounter (HOSPITAL_COMMUNITY): Payer: Self-pay | Admitting: Internal Medicine

## 2020-07-21 ENCOUNTER — Emergency Department (HOSPITAL_COMMUNITY): Payer: Self-pay

## 2020-07-21 ENCOUNTER — Inpatient Hospital Stay (HOSPITAL_COMMUNITY): Payer: Self-pay | Admitting: Certified Registered Nurse Anesthetist

## 2020-07-21 ENCOUNTER — Inpatient Hospital Stay: Admit: 2020-07-21 | Payer: Self-pay | Admitting: Orthopedic Surgery

## 2020-07-21 DIAGNOSIS — M009 Pyogenic arthritis, unspecified: Secondary | ICD-10-CM | POA: Diagnosis present

## 2020-07-21 HISTORY — PX: INCISION AND DRAINAGE ABSCESS: SHX5864

## 2020-07-21 LAB — CBC WITH DIFFERENTIAL/PLATELET
Abs Immature Granulocytes: 0.23 10*3/uL — ABNORMAL HIGH (ref 0.00–0.07)
Basophils Absolute: 0.1 10*3/uL (ref 0.0–0.1)
Basophils Relative: 0 %
Eosinophils Absolute: 0 10*3/uL (ref 0.0–0.5)
Eosinophils Relative: 0 %
HCT: 47.3 % (ref 39.0–52.0)
Hemoglobin: 16 g/dL (ref 13.0–17.0)
Immature Granulocytes: 1 %
Lymphocytes Relative: 6 %
Lymphs Abs: 1.7 10*3/uL (ref 0.7–4.0)
MCH: 32.3 pg (ref 26.0–34.0)
MCHC: 33.8 g/dL (ref 30.0–36.0)
MCV: 95.6 fL (ref 80.0–100.0)
Monocytes Absolute: 1.7 10*3/uL — ABNORMAL HIGH (ref 0.1–1.0)
Monocytes Relative: 6 %
Neutro Abs: 24 10*3/uL — ABNORMAL HIGH (ref 1.7–7.7)
Neutrophils Relative %: 87 %
Platelets: 311 10*3/uL (ref 150–400)
RBC: 4.95 MIL/uL (ref 4.22–5.81)
RDW: 12.4 % (ref 11.5–15.5)
WBC: 27.6 10*3/uL — ABNORMAL HIGH (ref 4.0–10.5)
nRBC: 0 % (ref 0.0–0.2)

## 2020-07-21 LAB — COMPREHENSIVE METABOLIC PANEL
ALT: 27 U/L (ref 0–44)
AST: 28 U/L (ref 15–41)
Albumin: 3.9 g/dL (ref 3.5–5.0)
Alkaline Phosphatase: 72 U/L (ref 38–126)
Anion gap: 10 (ref 5–15)
BUN: 13 mg/dL (ref 6–20)
CO2: 23 mmol/L (ref 22–32)
Calcium: 9.4 mg/dL (ref 8.9–10.3)
Chloride: 101 mmol/L (ref 98–111)
Creatinine, Ser: 0.94 mg/dL (ref 0.61–1.24)
GFR, Estimated: >60 mL/min (ref >60)
Glucose, Bld: 99 mg/dL (ref 70–99)
Potassium: 3.9 mmol/L (ref 3.5–5.1)
Sodium: 134 mmol/L — ABNORMAL LOW (ref 135–145)
Total Bilirubin: 1.2 mg/dL (ref 0.3–1.2)
Total Protein: 8.1 g/dL (ref 6.5–8.1)

## 2020-07-21 LAB — URINALYSIS, ROUTINE W REFLEX MICROSCOPIC
Bacteria, UA: NONE SEEN
Bilirubin Urine: NEGATIVE
Glucose, UA: NEGATIVE mg/dL
Ketones, ur: 20 mg/dL — AB
Leukocytes,Ua: NEGATIVE
Nitrite: NEGATIVE
Protein, ur: 100 mg/dL — AB
Specific Gravity, Urine: 1.028 (ref 1.005–1.030)
pH: 5 (ref 5.0–8.0)

## 2020-07-21 LAB — LACTIC ACID, PLASMA
Lactic Acid, Venous: 0.8 mmol/L (ref 0.5–1.9)
Lactic Acid, Venous: 1 mmol/L (ref 0.5–1.9)
Lactic Acid, Venous: 1.2 mmol/L (ref 0.5–1.9)

## 2020-07-21 LAB — RESP PANEL BY RT-PCR (FLU A&B, COVID) ARPGX2
Influenza A by PCR: NEGATIVE
Influenza B by PCR: NEGATIVE
SARS Coronavirus 2 by RT PCR: NEGATIVE

## 2020-07-21 LAB — MRSA NEXT GEN BY PCR, NASAL: MRSA by PCR Next Gen: NOT DETECTED

## 2020-07-21 SURGERY — INCISION AND DRAINAGE, ABSCESS
Anesthesia: General | Laterality: Left

## 2020-07-21 MED ORDER — BUPIVACAINE LIPOSOME 1.3 % IJ SUSP
INTRAMUSCULAR | Status: DC | PRN
  Administered 2020-07-21: 12 mL

## 2020-07-21 MED ORDER — BUPIVACAINE HCL (PF) 0.5 % IJ SOLN
INTRAMUSCULAR | Status: AC
  Filled 2020-07-21: qty 30

## 2020-07-21 MED ORDER — VANCOMYCIN HCL 1250 MG/250ML IV SOLN
1250.0000 mg | INTRAVENOUS | Status: AC
  Administered 2020-07-21: 1250 mg via INTRAVENOUS
  Filled 2020-07-21: qty 250

## 2020-07-21 MED ORDER — LIDOCAINE HCL (PF) 1 % IJ SOLN
5.0000 mL | Freq: Once | INTRAMUSCULAR | Status: AC
  Administered 2020-07-21: 5 mL
  Filled 2020-07-21: qty 30

## 2020-07-21 MED ORDER — SODIUM CHLORIDE 0.9 % IV SOLN
2.0000 g | Freq: Once | INTRAVENOUS | Status: AC
  Administered 2020-07-21: 2 g via INTRAVENOUS
  Filled 2020-07-21: qty 20

## 2020-07-21 MED ORDER — LIDOCAINE 2% (20 MG/ML) 5 ML SYRINGE
INTRAMUSCULAR | Status: AC
  Filled 2020-07-21: qty 5

## 2020-07-21 MED ORDER — HYDROMORPHONE HCL 1 MG/ML IJ SOLN
1.0000 mg | Freq: Once | INTRAMUSCULAR | Status: AC
  Administered 2020-07-21: 1 mg via INTRAVENOUS
  Filled 2020-07-21: qty 1

## 2020-07-21 MED ORDER — SODIUM CHLORIDE 0.9 % IV SOLN: INTRAVENOUS | Status: AC

## 2020-07-21 MED ORDER — ONDANSETRON HCL 4 MG PO TABS: 4.0000 mg | ORAL_TABLET | Freq: Four times a day (QID) | ORAL | Status: DC | PRN

## 2020-07-21 MED ORDER — METOCLOPRAMIDE HCL 5 MG/ML IJ SOLN: 5.0000 mg | Freq: Three times a day (TID) | INTRAMUSCULAR | Status: DC | PRN

## 2020-07-21 MED ORDER — FENTANYL CITRATE (PF) 100 MCG/2ML IJ SOLN: 25.0000 ug | INTRAMUSCULAR | Status: DC | PRN

## 2020-07-21 MED ORDER — MIDAZOLAM HCL 2 MG/2ML IJ SOLN
INTRAMUSCULAR | Status: AC
  Filled 2020-07-21: qty 2

## 2020-07-21 MED ORDER — ACETAMINOPHEN 325 MG PO TABS
325.0000 mg | ORAL_TABLET | Freq: Four times a day (QID) | ORAL | Status: DC | PRN
  Administered 2020-07-27 – 2020-08-02 (×9): 650 mg via ORAL
  Filled 2020-07-21 (×9): qty 2

## 2020-07-21 MED ORDER — ACETAMINOPHEN 500 MG PO TABS
1000.0000 mg | ORAL_TABLET | Freq: Once | ORAL | Status: AC
  Administered 2020-07-21: 1000 mg via ORAL
  Filled 2020-07-21: qty 2

## 2020-07-21 MED ORDER — LACTATED RINGERS IV BOLUS
1000.0000 mL | Freq: Once | INTRAVENOUS | Status: AC
  Administered 2020-07-21: 1000 mL via INTRAVENOUS

## 2020-07-21 MED ORDER — CHLORHEXIDINE GLUCONATE 0.12 % MT SOLN
15.0000 mL | Freq: Once | OROMUCOSAL | Status: AC
  Administered 2020-07-21: 15 mL via OROMUCOSAL

## 2020-07-21 MED ORDER — LACTATED RINGERS IV SOLN: INTRAVENOUS | Status: DC

## 2020-07-21 MED ORDER — HYDROCODONE-ACETAMINOPHEN 7.5-325 MG PO TABS
1.0000 | ORAL_TABLET | ORAL | Status: DC | PRN
  Administered 2020-07-23: 1 via ORAL
  Administered 2020-07-24: 2 via ORAL
  Filled 2020-07-21: qty 1
  Filled 2020-07-21: qty 2

## 2020-07-21 MED ORDER — AMISULPRIDE (ANTIEMETIC) 5 MG/2ML IV SOLN: 10.0000 mg | Freq: Once | INTRAVENOUS | Status: DC | PRN

## 2020-07-21 MED ORDER — PANTOPRAZOLE SODIUM 40 MG PO TBEC
40.0000 mg | DELAYED_RELEASE_TABLET | Freq: Every day | ORAL | Status: DC
  Administered 2020-07-21 – 2020-08-03 (×14): 40 mg via ORAL
  Filled 2020-07-21 (×14): qty 1

## 2020-07-21 MED ORDER — ONDANSETRON HCL 4 MG/2ML IJ SOLN: 4.0000 mg | Freq: Four times a day (QID) | INTRAMUSCULAR | Status: DC | PRN

## 2020-07-21 MED ORDER — MIDAZOLAM HCL 5 MG/5ML IJ SOLN
INTRAMUSCULAR | Status: DC | PRN
  Administered 2020-07-21: 1 mg via INTRAVENOUS

## 2020-07-21 MED ORDER — ORAL CARE MOUTH RINSE: 15.0000 mL | Freq: Once | OROMUCOSAL | Status: AC

## 2020-07-21 MED ORDER — FENTANYL CITRATE (PF) 100 MCG/2ML IJ SOLN
INTRAMUSCULAR | Status: DC | PRN
  Administered 2020-07-21 (×3): 50 ug via INTRAVENOUS

## 2020-07-21 MED ORDER — FENTANYL CITRATE (PF) 100 MCG/2ML IJ SOLN
50.0000 ug | INTRAMUSCULAR | Status: DC
  Filled 2020-07-21: qty 2

## 2020-07-21 MED ORDER — VANCOMYCIN HCL IN DEXTROSE 1-5 GM/200ML-% IV SOLN
1000.0000 mg | Freq: Two times a day (BID) | INTRAVENOUS | Status: DC
  Administered 2020-07-21 – 2020-07-22 (×2): 1000 mg via INTRAVENOUS
  Filled 2020-07-21 (×4): qty 200

## 2020-07-21 MED ORDER — ACETAMINOPHEN 650 MG RE SUPP: 650.0000 mg | Freq: Four times a day (QID) | RECTAL | Status: DC | PRN

## 2020-07-21 MED ORDER — FENTANYL CITRATE (PF) 100 MCG/2ML IJ SOLN
50.0000 ug | INTRAMUSCULAR | Status: DC
Start: 2020-07-21 — End: 2020-07-21
  Administered 2020-07-21: 100 ug via INTRAVENOUS

## 2020-07-21 MED ORDER — BACITRACIN-NEOMYCIN-POLYMYXIN OINTMENT TUBE
TOPICAL_OINTMENT | CUTANEOUS | Status: AC
  Filled 2020-07-21: qty 14.17

## 2020-07-21 MED ORDER — ACETAMINOPHEN 500 MG PO TABS
500.0000 mg | ORAL_TABLET | Freq: Four times a day (QID) | ORAL | Status: AC
  Administered 2020-07-21 – 2020-07-22 (×4): 500 mg via ORAL
  Filled 2020-07-21 (×4): qty 1

## 2020-07-21 MED ORDER — PROMETHAZINE HCL 25 MG/ML IJ SOLN: 6.2500 mg | INTRAMUSCULAR | Status: DC | PRN

## 2020-07-21 MED ORDER — CHLORHEXIDINE GLUCONATE CLOTH 2 % EX PADS
6.0000 | MEDICATED_PAD | Freq: Every day | CUTANEOUS | Status: DC
  Administered 2020-07-21 – 2020-08-02 (×13): 6 via TOPICAL

## 2020-07-21 MED ORDER — METHOCARBAMOL 500 MG PO TABS
500.0000 mg | ORAL_TABLET | Freq: Four times a day (QID) | ORAL | Status: DC | PRN
  Administered 2020-07-24 – 2020-08-01 (×3): 500 mg via ORAL
  Filled 2020-07-21 (×3): qty 1

## 2020-07-21 MED ORDER — SODIUM CHLORIDE 0.9 % IR SOLN
Status: DC | PRN
  Administered 2020-07-21: 3000 mL

## 2020-07-21 MED ORDER — ONDANSETRON HCL 4 MG/2ML IJ SOLN
INTRAMUSCULAR | Status: DC | PRN
  Administered 2020-07-21: 4 mg via INTRAVENOUS

## 2020-07-21 MED ORDER — METHOCARBAMOL 1000 MG/10ML IJ SOLN
500.0000 mg | Freq: Four times a day (QID) | INTRAVENOUS | Status: DC | PRN
  Filled 2020-07-21: qty 5

## 2020-07-21 MED ORDER — SODIUM CHLORIDE 0.9 % IV SOLN
2.0000 g | INTRAVENOUS | Status: DC
  Administered 2020-07-22: 2 g via INTRAVENOUS
  Filled 2020-07-21: qty 20

## 2020-07-21 MED ORDER — PHENYLEPHRINE 40 MCG/ML (10ML) SYRINGE FOR IV PUSH (FOR BLOOD PRESSURE SUPPORT)
PREFILLED_SYRINGE | INTRAVENOUS | Status: DC | PRN
  Administered 2020-07-21 (×2): 40 ug via INTRAVENOUS

## 2020-07-21 MED ORDER — HYDROCODONE-ACETAMINOPHEN 5-325 MG PO TABS
1.0000 | ORAL_TABLET | ORAL | Status: DC | PRN
  Administered 2020-07-22: 1 via ORAL
  Administered 2020-07-23 – 2020-07-25 (×2): 2 via ORAL
  Filled 2020-07-21 (×2): qty 2
  Filled 2020-07-21: qty 1

## 2020-07-21 MED ORDER — BUPIVACAINE LIPOSOME 1.3 % IJ SUSP
20.0000 mL | Freq: Once | INTRAMUSCULAR | Status: DC
  Filled 2020-07-21: qty 20

## 2020-07-21 MED ORDER — FENTANYL CITRATE (PF) 250 MCG/5ML IJ SOLN
INTRAMUSCULAR | Status: AC
  Filled 2020-07-21: qty 5

## 2020-07-21 MED ORDER — ONDANSETRON HCL 4 MG/2ML IJ SOLN
INTRAMUSCULAR | Status: AC
  Filled 2020-07-21: qty 2

## 2020-07-21 MED ORDER — ROPIVACAINE HCL 5 MG/ML IJ SOLN
INTRAMUSCULAR | Status: DC | PRN
  Administered 2020-07-21: 30 mL via PERINEURAL

## 2020-07-21 MED ORDER — PHENYLEPHRINE 40 MCG/ML (10ML) SYRINGE FOR IV PUSH (FOR BLOOD PRESSURE SUPPORT)
PREFILLED_SYRINGE | INTRAVENOUS | Status: AC
  Filled 2020-07-21: qty 10

## 2020-07-21 MED ORDER — MORPHINE SULFATE (PF) 2 MG/ML IV SOLN: 1.0000 mg | INTRAVENOUS | Status: DC | PRN

## 2020-07-21 MED ORDER — MIDAZOLAM HCL 2 MG/2ML IJ SOLN
1.0000 mg | INTRAMUSCULAR | Status: DC
  Administered 2020-07-21: 2 mg via INTRAVENOUS

## 2020-07-21 MED ORDER — MIDAZOLAM HCL 2 MG/2ML IJ SOLN: 1.0000 mg | INTRAMUSCULAR | Status: DC

## 2020-07-21 MED ORDER — SODIUM CHLORIDE 0.9 % IV BOLUS
1000.0000 mL | Freq: Once | INTRAVENOUS | Status: AC
  Administered 2020-07-21: 1000 mL via INTRAVENOUS

## 2020-07-21 MED ORDER — METOCLOPRAMIDE HCL 5 MG PO TABS: 5.0000 mg | ORAL_TABLET | Freq: Three times a day (TID) | ORAL | Status: DC | PRN

## 2020-07-21 MED ORDER — CELECOXIB 200 MG PO CAPS
200.0000 mg | ORAL_CAPSULE | Freq: Once | ORAL | Status: AC
  Administered 2020-07-21: 200 mg via ORAL
  Filled 2020-07-21: qty 1

## 2020-07-21 MED ORDER — DOCUSATE SODIUM 100 MG PO CAPS
100.0000 mg | ORAL_CAPSULE | Freq: Two times a day (BID) | ORAL | Status: DC
  Administered 2020-07-21 – 2020-08-03 (×26): 100 mg via ORAL
  Filled 2020-07-21 (×26): qty 1

## 2020-07-21 MED ORDER — PROPOFOL 10 MG/ML IV BOLUS
INTRAVENOUS | Status: DC | PRN
  Administered 2020-07-21: 200 mg via INTRAVENOUS

## 2020-07-21 MED ORDER — PROPOFOL 10 MG/ML IV BOLUS
INTRAVENOUS | Status: AC
  Filled 2020-07-21: qty 20

## 2020-07-21 MED ORDER — MORPHINE SULFATE (PF) 2 MG/ML IV SOLN: 0.5000 mg | INTRAVENOUS | Status: DC | PRN

## 2020-07-21 MED ORDER — ACETAMINOPHEN 325 MG PO TABS: 650.0000 mg | ORAL_TABLET | Freq: Four times a day (QID) | ORAL | Status: DC | PRN

## 2020-07-21 MED ORDER — DEXAMETHASONE SODIUM PHOSPHATE 10 MG/ML IJ SOLN
INTRAMUSCULAR | Status: DC | PRN
  Administered 2020-07-21 (×2): 5 mg

## 2020-07-21 SURGICAL SUPPLY — 24 items
BNDG GAUZE ELAST 4 BULKY (GAUZE/BANDAGES/DRESSINGS) ×2 IMPLANT
CUFF TOURN SGL QUICK 24 (TOURNIQUET CUFF) ×2
CUFF TRNQT CYL 24X4X16.5-23 (TOURNIQUET CUFF) IMPLANT
DRAPE U-SHAPE 47X51 STRL (DRAPES) ×2 IMPLANT
DRSG EMULSION OIL 3X16 NADH (GAUZE/BANDAGES/DRESSINGS) ×1 IMPLANT
DURAPREP 26ML APPLICATOR (WOUND CARE) ×1 IMPLANT
GAUZE SPONGE 4X4 12PLY STRL (GAUZE/BANDAGES/DRESSINGS) ×1 IMPLANT
GLOVE SURG LTX SZ8.5 (GLOVE) ×2 IMPLANT
GLOVE SURG UNDER POLY LF SZ8.5 (GLOVE) ×2 IMPLANT
GOWN SPEC L3 XXLG W/TWL (GOWN DISPOSABLE) ×2 IMPLANT
GOWN STRL REUS W/TWL XL LVL3 (GOWN DISPOSABLE) ×1 IMPLANT
KIT TURNOVER KIT A (KITS) ×2 IMPLANT
NEEDLE HYPO 22GX1.5 SAFETY (NEEDLE) ×2 IMPLANT
PACK ORTHO EXTREMITY (CUSTOM PROCEDURE TRAY) ×2 IMPLANT
PAD CAST 4YDX4 CTTN HI CHSV (CAST SUPPLIES) IMPLANT
PADDING CAST COTTON 4X4 STRL (CAST SUPPLIES) ×2
SLING ARM FOAM STRAP LRG (SOFTGOODS) ×1 IMPLANT
SUT ETHILON 3 0 PS 1 (SUTURE) ×1 IMPLANT
SUT PROLENE 2 0 SH DA (SUTURE) ×2 IMPLANT
SUT VIC AB 2-0 SH 27 (SUTURE) ×2
SUT VIC AB 2-0 SH 27X BRD (SUTURE) IMPLANT
SYR 20ML LL LF (SYRINGE) ×2 IMPLANT
TOWEL OR 17X26 10 PK STRL BLUE (TOWEL DISPOSABLE) ×2 IMPLANT
TOWEL OR NON WOVEN STRL DISP B (DISPOSABLE) ×2 IMPLANT

## 2020-07-21 NOTE — ED Notes (Signed)
Back from Xray.

## 2020-07-21 NOTE — Anesthesia Procedure Notes (Signed)
Procedure Name: LMA Insertion Date/Time: 07/21/2020 10:16 AM Performed by: Orest Dikes, CRNA Pre-anesthesia Checklist: Patient identified, Emergency Drugs available, Suction available and Patient being monitored Patient Re-evaluated:Patient Re-evaluated prior to induction Oxygen Delivery Method: Circle system utilized Preoxygenation: Pre-oxygenation with 100% oxygen Induction Type: IV induction LMA: LMA with gastric port inserted LMA Size: 5.0 Number of attempts: 1 Placement Confirmation: positive ETCO2 and breath sounds checked- equal and bilateral Tube secured with: Tape Dental Injury: Teeth and Oropharynx as per pre-operative assessment

## 2020-07-21 NOTE — Anesthesia Procedure Notes (Signed)
Anesthesia Regional Block: Supraclavicular block   Pre-Anesthetic Checklist: , timeout performed,  Correct Patient, Correct Site, Correct Laterality,  Correct Procedure, Correct Position, site marked,  Risks and benefits discussed,  Surgical consent,  Pre-op evaluation,  At surgeon's request and post-op pain management  Laterality: Left  Prep: chloraprep       Needles:  Injection technique: Single-shot  Needle Type: Echogenic Stimulator Needle     Needle Length: 5cm  Needle Gauge: 22     Additional Needles:   Narrative:  Start time: 07/21/2020 9:41 AM End time: 07/21/2020 9:51 AM Injection made incrementally with aspirations every 5 mL.  Performed by: Personally  Anesthesiologist: Heather Roberts, MD  Additional Notes: Functioning IV was confirmed and monitors applied.  A 67mm 22ga echogenic arrow stimulator was used. Sterile prep and drape,hand hygiene and sterile gloves were used.Ultrasound guidance: relevant anatomy identified, needle position confirmed, local anesthetic spread visualized around nerve(s)., vascular puncture avoided.  Image printed for medical record.  Negative aspiration and negative test dose prior to incremental administration of local anesthetic. The patient tolerated the procedure well.

## 2020-07-21 NOTE — Progress Notes (Signed)
Pharmacy Antibiotic Note  [name] is a 35 y.o. male admitted on 07/20/2020 with L elbow septic arthritis .  Pharmacy has been consulted for vancomcyin dosing. L elbow was aspirated & was purulent.  Ortho consulted Tm 101, WBC 27.6 SCr 0.94  Plan: Vancomycin 1250 mg IV x 1 then 1000 mg IV q12 for estimated AUC 499 using SCr 0.94 & Vd 0.72 Ceftriaxone 2 mg IV q24 F/u renal function, WBC, temp, culture data Vancomycin levels as needed   Temp (24hrs), Avg:99.9 F (37.7 C), Min:98.4 F (36.9 C), Max:101 F (38.3 C)  Recent Labs  Lab 07/20/20 2330  WBC 27.6*  CREATININE 0.94  LATICACIDVEN 0.8    CrCl cannot be calculated (Unknown ideal weight.).    Allergies  Allergen Reactions   Fish Allergy Hives and Itching    Antimicrobials this admission: 7/19 vanc>> 7/19 CTX>> Dose adjustments this admission:  Microbiology results:  7/19 L elbow synovial fluid:  Thank you for allowing pharmacy to be a part of this patient's care.  Herby Abraham, Pharm.D 07/21/2020 8:14 AM

## 2020-07-21 NOTE — Brief Op Note (Signed)
07/21/2020  11:30 AM  PATIENT:  [name]  35 y.o. male  PRE-OPERATIVE DIAGNOSIS:  INFECTED RIGHT ELBOW  POST-OPERATIVE DIAGNOSIS:  INFECTED RIGHT ELBOW  PROCEDURE:  Procedure(s): INCISION AND DRAINAGE ABSCESS (Left)  SURGEON:  Surgeon(s) and Role:    Venita Lick, MD - Primary  PHYSICIAN ASSISTANT:   ASSISTANTS: none   ANESTHESIA:   regional and general  EBL:  0 mL   BLOOD ADMINISTERED:none  DRAINS: none   LOCAL MEDICATIONS USED:  MARCAINE    and OTHER Exparel  SPECIMEN: Left elbow joint  DISPOSITION OF SPECIMEN:   Microbiology Gram stain and cultures  COUNTS:  YES  TOURNIQUET:   Total Tourniquet Time Documented: Upper Arm (Left) - 35 minutes Total: Upper Arm (Left) - 35 minutes   DICTATION: .Reubin Milan Dictation  PLAN OF CARE: Admit for overnight observation  PATIENT DISPOSITION:  PACU - hemodynamically stable.

## 2020-07-21 NOTE — Anesthesia Preprocedure Evaluation (Addendum)
Anesthesia Evaluation  Patient identified by MRN, date of birth, ID band Patient awake    Reviewed: Allergy & Precautions, NPO status , Patient's Chart, lab work & pertinent test results  History of Anesthesia Complications Negative for: history of anesthetic complications  Airway Mallampati: II  TM Distance: >3 FB Neck ROM: Full    Dental  (+) Poor Dentition, Dental Advisory Given   Pulmonary Current Smoker,    Pulmonary exam normal        Cardiovascular negative cardio ROS Normal cardiovascular exam     Neuro/Psych negative neurological ROS     GI/Hepatic negative GI ROS, Neg liver ROS,   Endo/Other  negative endocrine ROS  Renal/GU negative Renal ROS     Musculoskeletal   Abdominal   Peds  Hematology negative hematology ROS (+)   Anesthesia Other Findings   Reproductive/Obstetrics                            Anesthesia Physical Anesthesia Plan  ASA: 2 and emergent  Anesthesia Plan: General   Post-op Pain Management:  Regional for Post-op pain   Induction: Intravenous  PONV Risk Score and Plan: 2 and Ondansetron and Midazolam  Airway Management Planned: LMA  Additional Equipment:   Intra-op Plan:   Post-operative Plan: Extubation in OR  Informed Consent: I have reviewed the patients History and Physical, chart, labs and discussed the procedure including the risks, benefits and alternatives for the proposed anesthesia with the patient or authorized representative who has indicated his/her understanding and acceptance.     Dental advisory given  Plan Discussed with: Anesthesiologist  Anesthesia Plan Comments:        Anesthesia Quick Evaluation

## 2020-07-21 NOTE — ED Notes (Signed)
Placed a call back to Pilar Plate MD from [facility] MD at 5:45 am

## 2020-07-21 NOTE — Progress Notes (Signed)
A consult was received from an ED physician for Vancomcyin per pharmacy dosing.  The patient's profile has been reviewed for ht/wt/allergies/indication/available labs.    A one time order has been placed for Vancomycin 1250mg  IV.    Further antibiotics/pharmacy consults should be ordered by admitting physician if indicated.                       Thank you, , PharmD 07/21/2020  4:01 AM

## 2020-07-21 NOTE — ED Provider Notes (Addendum)
[facility] Provider Note MRN:  [id number]  Arrival date & time: 07/21/20     Chief Complaint   Elbow Pain and Flank Pain   History of Present Illness   [name] is a 35 y.o. year-old male with no pertinent past medical history presenting to the ED with chief complaint of elbow pain.  Patient had a rib injury at work few weeks ago, continues to endorse right-sided rib pain.  For the past few days has been having fever, malaise, fatigue.  Also having moderate to severe left elbow pain, no recent trauma.  He had a cut on his left wrist that became infected last week, was prescribed antibiotics but could not afford them.  The cut seems improved per patient.  Symptoms are constant, moderate, no exacerbating or alleviating factors.  Review of Systems  A complete 10 system review of systems was obtained and all systems are negative except as noted in the HPI and PMH.   Patient's Health History   No past medical history on file.  Past Surgical History:  Procedure Laterality Date   ORBITAL FRACTURE SURGERY  2009    No family history on file.  Social History   Socioeconomic History   Marital status: Significant Other    Spouse name: Not on file   Number of children: Not on file   Years of education: Not on file   Highest education level: Not on file  Occupational History   Not on file  Tobacco Use   Smoking status: Never   Smokeless tobacco: Never  Vaping Use   Vaping Use: Never used  Substance and Sexual Activity   Alcohol use: Yes    Comment: monthly- 1/2   Drug use: Yes    Types: Marijuana   Sexual activity: Yes  Other Topics Concern   Not on file  Social History Narrative   Not on file   Social Determinants of Health   Financial Resource Strain: Not on file  Food Insecurity: Not on file  Transportation Needs: Not on file  Physical Activity: Not on file  Stress: Not on file  Social Connections: Not on file   Intimate Partner Violence: Not on file     Physical Exam   Vitals:   07/21/20 0315 07/21/20 0330  BP: 105/60 103/60  Pulse: 74 70  Resp:  16  Temp:    SpO2: 96% 96%    CONSTITUTIONAL: Well-appearing, NAD NEURO:  Alert and oriented x 3, no focal deficits EYES:  eyes equal and reactive ENT/NECK:  no LAD, no JVD CARDIO: Regular rate, well-perfused, normal S1 and S2 PULM:  CTAB no wheezing or rhonchi GI/GU:  normal bowel sounds, non-distended, non-tender MSK/SPINE:  No gross deformities, no edema, reduced range of motion of left elbow SKIN:  no rash, atraumatic PSYCH:  Appropriate speech and behavior  *Additional and/or pertinent findings included in MDM below  Diagnostic and Interventional Summary    EKG Interpretation  Date/Time:    Ventricular Rate:    PR Interval:    QRS Duration:   QT Interval:    QTC Calculation:   R Axis:     Text Interpretation:         Labs Reviewed  COMPREHENSIVE METABOLIC PANEL - Abnormal; Notable for the following components:      Result Value   Sodium 134 (*)    All other components within normal limits  CBC WITH DIFFERENTIAL/PLATELET - Abnormal; Notable for the following components:  WBC 27.6 (*)    Neutro Abs 24.0 (*)    Monocytes Absolute 1.7 (*)    Abs Immature Granulocytes 0.23 (*)    All other components within normal limits  URINALYSIS, ROUTINE W REFLEX MICROSCOPIC - Abnormal; Notable for the following components:   APPearance HAZY (*)    Hgb urine dipstick SMALL (*)    Ketones, ur 20 (*)    Protein, ur 100 (*)    All other components within normal limits  BODY FLUID CULTURE W GRAM STAIN  LACTIC ACID, PLASMA  LACTIC ACID, PLASMA    DG Elbow Complete Left  Final Result    DG Chest 2 View  Final Result      Medications  cefTRIAXone (ROCEPHIN) 2 g in sodium chloride 0.9 % 100 mL IVPB (has no administration in time range)  vancomycin (VANCOREADY) IVPB 1250 mg/250 mL (has no administration in time range)  sodium  chloride 0.9 % bolus 1,000 mL (has no administration in time range)  HYDROmorphone (DILAUDID) injection 1 mg (1 mg Intravenous Given 07/21/20 0215)  lidocaine (PF) (XYLOCAINE) 1 % injection 5 mL (5 mLs Infiltration Given 07/21/20 0335)     Procedures  /  Critical Care Ultrasound ED Soft Tissue  Date/Time: 07/21/2020 2:24 AM Performed by: Sabas Sous, MD Authorized by: Sabas Sous, MD   Procedure details:    Indications comment:  Evaluate for joint effusion   Transverse view:  Visualized   Longitudinal view:  Visualized   Images: archived   Location:    Location comment:  Left elbow Comments:     Elbow effusion noted on ultrasound .Joint Aspiration/Arthrocentesis  Date/Time: 07/21/2020 4:03 AM Performed by: Sabas Sous, MD Authorized by: Sabas Sous, MD   Consent:    Consent obtained:  Verbal   Consent given by:  Patient   Risks, benefits, and alternatives were discussed: yes     Risks discussed:  Bleeding, incomplete drainage, infection, nerve damage, pain and poor cosmetic result Universal protocol:    Procedure explained and questions answered to patient or proxy's satisfaction: yes     Immediately prior to procedure, a time out was called: yes     Patient identity confirmed:  Verbally with patient Location:    Location:  Elbow   Elbow:  L elbow Anesthesia:    Anesthesia method:  Local infiltration   Local anesthetic:  Lidocaine 1% w/o epi Procedure details:    Preparation: Patient was prepped and draped in usual sterile fashion     Needle gauge:  18 G   Ultrasound guidance: yes     Approach:  Lateral   Aspirate amount:  2cc   Aspirate characteristics:  Purulent and blood-tinged Post-procedure details:    Dressing:  Adhesive bandage   Procedure completion:  Tolerated well, no immediate complications .Critical Care  Date/Time: 07/21/2020 5:49 AM Performed by: Sabas Sous, MD Authorized by: Sabas Sous, MD   Critical care provider  statement:    Critical care time (minutes):  45   Critical care was necessary to treat or prevent imminent or life-threatening deterioration of the following conditions: Concern for septic joint requiring emergent surgical evaluation.   Critical care was time spent personally by me on the following activities:  Discussions with consultants, evaluation of patient's response to treatment, examination of patient, ordering and performing treatments and interventions, ordering and review of laboratory studies, ordering and review of radiographic studies, pulse oximetry, re-evaluation of patient's condition, obtaining history from patient  or surrogate and review of old charts  ED Course and Medical Decision Making  I have reviewed the triage vital signs, the nursing notes, and pertinent available records from the EMR.  Listed above are laboratory and imaging tests that I personally ordered, reviewed, and interpreted and then considered in my medical decision making (see below for details).  Fever, leukocytosis, nontraumatic elbow pain, considering septic joint.  Also considered pneumonia after some rib injury at work but x-ray is clear.  Suspect need for arthrocentesis.     X-ray reassuring.  Repeated ultrasound at bedside shows a small but present joint effusion.  Arthrocentesis performed, able to obtain very small amount of fluid that did seem purulent.  Will consult orthopedics, suspect need for admission.  Starting empiric antibiotics.   Dr. Shon Baton orthopedics to see the patient this morning.  Will admit to medicine.   Elmer Sow. Pilar Plate, MD [facility] Emergency Medicine [facility] [email or web address]  Final Clinical Impressions(s) / ED Diagnoses     ICD-10-CM   1. Fever, unspecified fever cause  R50.9     2. Arthralgia of left elbow  M25.522     3. Leukocytosis, unspecified type  D72.829       ED Discharge Orders     None        Discharge Instructions Discussed  with and Provided to Patient:   Discharge Instructions   None       Sabas Sous, MD 07/21/20 0405    Sabas Sous, MD 07/21/20 [id number]

## 2020-07-21 NOTE — H&P (Signed)
Chief Complaint: Severe left elbow pain.  Question septic arthritis History:  History reviewed. No pertinent past medical history.  Patient had a rib injury at work few weeks ago, continues to endorse right-sided rib pain.  For the past few days has been having fever, malaise, fatigue.  Also having moderate to severe left elbow pain, no recent trauma.  He had a cut on his left wrist that became infected last week, was prescribed antibiotics but could not afford them.  The cut seems improved per patient.  Symptoms are constant, moderate, no exacerbating or alleviating factors  Review of systems Patient reports recent fevers and chills approximately 36 hours.  No loss of consciousness, blurry vision, or headaches   Allergies  Allergen Reactions   Fish Allergy Hives and Itching    No current facility-administered medications on file prior to encounter.   Current Outpatient Medications on File Prior to Encounter  Medication Sig Dispense Refill   acetaminophen (TYLENOL) 500 MG tablet Take 1,000 mg by mouth every 6 (six) hours as needed.     doxycycline (VIBRAMYCIN) 100 MG capsule Take 1 capsule (100 mg total) by mouth 2 (two) times daily. (Patient not taking: Reported on 07/21/2020) 20 capsule 0   terbinafine (LAMISIL) 1 % cream Apply 1 application topically 2 (two) times daily. (Patient not taking: Reported on 07/21/2020) 30 g 0   tiZANidine (ZANAFLEX) 4 MG tablet Take 1 tablet (4 mg total) by mouth every 6 (six) hours as needed for muscle spasms. (Patient not taking: Reported on 07/21/2020) 20 tablet 0    Physical Exam: Vitals:   07/21/20 0530 07/21/20 0726  BP: 111/67 115/63  Pulse: 73 70  Resp: 17 17  Temp:  98.4 F (36.9 C)  SpO2: 96% 97%   There is no height or weight on file to calculate BMI. He is alert and oriented x3. No shortness of breath or chest pain.  Lungs are clear to auscultation.  Cardiac: Regular rate and rhythm no rubs gallops murmurs Abdomen: Soft and  nontender.  No nausea or vomiting.  No incontinence of bowel bladder Significant left elbow pain and slight swelling is noted.  Pain with attempts at palpation or gentle range of motion.  He is unable to flex or extend or rotate the elbow.  He maintains it in a neutral position.  There is no elbow or shoulder pain with palpation or range of motion.  No forearm swelling or tenderness in the compartments.   Upper extremity neurological exam is intact with normal sensation light touch.  Distal motor exam is intact.  Image: DG Chest 2 View  Result Date: 07/20/2020 CLINICAL DATA:  Rib pain and fever. Patient reports right flank pain after injury at work recently. EXAM: CHEST - 2 VIEW COMPARISON:  Chest radiograph 07/10/2020 FINDINGS: The cardiomediastinal contours are normal. The lungs are clear. Pulmonary vasculature is normal. No consolidation, pleural effusion, or pneumothorax. No acute osseous abnormalities are seen. No visualized rib fractures on this non dedicated rib exam. IMPRESSION: Negative radiographs of the chest. Electronically Signed   By: Narda Rutherford M.D.   On: 07/20/2020 23:44   DG Chest 2 View  Result Date: 07/10/2020 CLINICAL DATA:  Shortness of breath. EXAM: CHEST - 2 VIEW COMPARISON:  None. FINDINGS: The heart size and mediastinal contours are within normal limits. Both lungs are clear. The visualized skeletal structures are unremarkable. IMPRESSION: No active cardiopulmonary disease. Electronically Signed   By: Zenda Alpers.D.  On: 07/10/2020 13:25   DG Elbow Complete Left  Result Date: 07/21/2020 CLINICAL DATA:  Severe elbow pain EXAM: LEFT ELBOW - COMPLETE 3+ VIEW COMPARISON:  None. FINDINGS: No fracture or dislocation is seen. The joint spaces are preserved. Visualized soft tissues are within normal limits. No displaced elbow joint fat pads to suggest an elbow joint effusion. IMPRESSION: Negative. Electronically Signed   By: Charline Bills M.D.   On: 07/21/2020 02:42     A/P: Collier is a pleasant 35 year old gentleman has had a 36-hour history of progressive left elbow pain and loss in range of motion.  Clinical exam is consistent with significant pain with any attempts at passive range of motion including flexion, extension, or supination/pronation.  There is no obvious laceration or abrasion or contusion to the elbow and he denies any recent trauma.  As result of the progressive severe pain and generalized malaise he presented to the emergency room.  I was contacted early this morning concerning the concern for a septic elbow.  There was a fluid collection on ultrasound that was tapped and it did appear to be purulent.  As result orthopedic consultation was requested.  At this point time the patient's clinical exam is concerning for a potential septic elbow.  I will plan on discussing his care with my colleague and we will move forward with definitive treatment.  More than likely this will be a irrigation and debridement.  The patient is scheduled to be admitted by the medical service and he is already started antibiotics.  Awaiting [facility] to apply a splint.

## 2020-07-21 NOTE — Transfer of Care (Signed)
Immediate Anesthesia Transfer of Care Note  Patient: [name]  Procedure(s) Performed: INCISION AND DRAINAGE ABSCESS (Left)  Patient Location: PACU  Anesthesia Type:General and Regional  Level of Consciousness: awake, alert  and oriented  Airway & Oxygen Therapy: Patient Spontanous Breathing and Patient connected to face mask oxygen  Post-op Assessment: Report given to RN and Post -op Vital signs reviewed and stable  Post vital signs: Reviewed and stable  Last Vitals:  Vitals Value Taken Time  BP    Temp    Pulse    Resp 11 07/21/20 1123  SpO2    Vitals shown include unvalidated device data.  Last Pain:  Vitals:   07/21/20 0955  TempSrc:   PainSc: Asleep         Complications: No notable events documented.

## 2020-07-21 NOTE — Progress Notes (Signed)
Assisted Dr. Singer with left, ultrasound guided, interscalene  block. Side rails up, monitors on throughout procedure. See vital signs in flow sheet. Tolerated Procedure well.  

## 2020-07-21 NOTE — Op Note (Signed)
OPERATIVE REPORT  DATE OF SURGERY: 07/21/2020  PATIENT NAME:  [name] MRN: [id number] DOB: 09-10-1985  PCP: Patient, No Pcp Per (Inactive)  PRE-OPERATIVE DIAGNOSIS: Septic left elbow  POST-OPERATIVE DIAGNOSIS: Same  PROCEDURE:   Irrigation and debridement of left septic elbow  SURGEON:  Venita Lick, MD  PHYSICIAN ASSISTANT: None  ANESTHESIA:   General /regional block also performed  EBL: Minimal   Complications: None  BRIEF HISTORY: [name] is a 35 y.o. male who presents the emergency room with a 36-hour history of significant left elbow pain ultrasound was done and aspirated from the elbow joint obtained some purulent material.  Because of the severe pain, malaise, and fevers we elected to take the patient to the operating room for a formal I&D of his left elbow.  All appropriate risks, benefits, and alternatives to surgery were discussed and consent was obtained.  PROCEDURE DETAILS: Patient was brought into the operating room and was properly positioned on the operating room table, and teds and SCDs were applied.  After induction with general anesthesia a LMA was placed..  A timeout was taken to confirm all important data: Including patient, procedure, and the level.  The arm was exsanguinated and the tourniquet was inflated.  The lateral epicondyle was palpated and an incision was made starting along the lateral epicondyle proceeding distally just below the level of the radial head.  Sharp dissection was carried out down to the ECRB tendon.  I then gently dissected through the ECRB until the joint capsule came into view.  I then went anterior to the radial head into the joint and the joint fluid was expressed.  Cultures were obtained and sent to pathology for appropriate analysis.  I then extended this approximately by elevated joint capsule over the distal humerus.  At this point I could now visualize the radiocapitellar joint.  After obtaining cultures I then  irrigated the joint with 3 L of normal saline.  The compartments remained soft throughout the entire case.  After the complete washout I then placed quarter percent Marcaine with Exparel into the joint and the ECRB in order to aid in postoperative analgesia.  The skin was then closed with a running vertical 3-0 nylon suture.  Adaptic and dry dressings and a posterior splint was applied.  The patient was ultimately extubated transfer the PACU without incident.  The end of the case all needle and sponge counts were correct.  There were no adverse intraoperative events.  Venita Lick, MD 07/21/2020 11:17 AM

## 2020-07-21 NOTE — H&P (Signed)
History and Physical    [name] PJK:[id number] DOB: Feb 02, 1985 DOA: 07/20/2020  PCP: Patient, No Pcp Per (Inactive)  Patient coming from: Home.  Chief Complaint: Left elbow pain and swelling.  HPI: [name] is a 35 y.o. male with no significant past medical history presents to the ER with swelling of the left elbow after he woke up from sleep last evening.  Has subjective feeling of fever chills.  Patient found it difficult to move the left elbow because of the swelling and pain which started acutely.  Denies any trauma or insect bite.  Denies any swelling of the other joints and denies any rash.  Denies any penile discharges.  About few days ago patient also had hit his right side of the chest on to a furniture when he was trying to move it and he has right-sided chest pain from that.  ED Course: In the ER patient's labs show leukocytosis of 27,000 patient's left elbow was x-rayed which did not show anything acute.  The left elbow was aspirated and per report was showing purulence.  Labs are pending.  Dr. Shon Baton of orthopedic surgeon has been consulted.  Blood cultures obtained started on empiric antibiotics admitted for further management.  Chest x-ray was unremarkable COVID test was negative.  Review of Systems: As per HPI, rest all negative.   History reviewed. No pertinent past medical history.  Past Surgical History:  Procedure Laterality Date   ORBITAL FRACTURE SURGERY  2009     reports that he has been smoking cigarettes. He has never used smokeless tobacco. He reports current alcohol use. He reports current drug use. Drug: Marijuana.  Allergies  Allergen Reactions   Fish Allergy Hives and Itching    Family History  Problem Relation Age of Onset   Diabetes Mellitus II Maternal Grandmother     Prior to Admission medications   Medication Sig Start Date End Date Taking? Authorizing Provider  acetaminophen (TYLENOL) 500 MG tablet Take 1,000 mg by mouth  every 6 (six) hours as needed.   Yes [provider]  doxycycline (VIBRAMYCIN) 100 MG capsule Take 1 capsule (100 mg total) by mouth 2 (two) times daily. Patient not taking: Reported on 07/21/2020 07/07/20   Ivette Loyal, NP  terbinafine (LAMISIL) 1 % cream Apply 1 application topically 2 (two) times daily. Patient not taking: Reported on 07/21/2020 10/07/19   Storm Frisk, MD  tiZANidine (ZANAFLEX) 4 MG tablet Take 1 tablet (4 mg total) by mouth every 6 (six) hours as needed for muscle spasms. Patient not taking: Reported on 07/21/2020 07/10/20   Ivette Loyal, NP    Physical Exam: Constitutional: Moderately built and nourished. Vitals:   07/21/20 0430 07/21/20 0500 07/21/20 0530 07/21/20 0726  BP: 110/63 111/65 111/67 115/63  Pulse: 67 68 73 70  Resp: 20 16 17 17   Temp:    98.4 F (36.9 C)  TempSrc:    Oral  SpO2: 98% 95% 96% 97%   Eyes: Anicteric no pallor. ENMT: No discharge from the ears eyes nose and mouth. Neck: No mass felt.  No neck rigidity.   Respiratory: No rhonchi or crepitations. Cardiovascular: S1-S2 heard. Abdomen: Soft nontender bowel sounds present. Musculoskeletal: Pain on moving left elbow.  Swelling in the left elbow. Skin: No obvious rash. Neurologic: Alert awake oriented to time place and person.  Moves all extremities. Psychiatric: Appears normal.  Normal affect.   Labs on Admission: I have personally reviewed following labs and imaging  studies  CBC: Recent Labs  Lab 07/20/20 2330  WBC 27.6*  NEUTROABS 24.0*  HGB 16.0  HCT 47.3  MCV 95.6  PLT 311   Basic Metabolic Panel: Recent Labs  Lab 07/20/20 2330  NA 134*  K 3.9  CL 101  CO2 23  GLUCOSE 99  BUN 13  CREATININE 0.94  CALCIUM 9.4   GFR: CrCl cannot be calculated (Unknown ideal weight.). Liver Function Tests: Recent Labs  Lab 07/20/20 2330  AST 28  ALT 27  ALKPHOS 72  BILITOT 1.2  PROT 8.1  ALBUMIN 3.9   No results for input(s): LIPASE, AMYLASE in the last 168  hours. No results for input(s): AMMONIA in the last 168 hours. Coagulation Profile: No results for input(s): INR, PROTIME in the last 168 hours. Cardiac Enzymes: No results for input(s): CKTOTAL, CKMB, CKMBINDEX, TROPONINI in the last 168 hours. BNP (last 3 results) No results for input(s): PROBNP in the last 8760 hours. HbA1C: No results for input(s): HGBA1C in the last 72 hours. CBG: No results for input(s): GLUCAP in the last 168 hours. Lipid Profile: No results for input(s): CHOL, HDL, LDLCALC, TRIG, CHOLHDL, LDLDIRECT in the last 72 hours. Thyroid Function Tests: No results for input(s): TSH, T4TOTAL, FREET4, T3FREE, THYROIDAB in the last 72 hours. Anemia Panel: No results for input(s): VITAMINB12, FOLATE, FERRITIN, TIBC, IRON, RETICCTPCT in the last 72 hours. Urine analysis:    Component Value Date/Time   COLORURINE YELLOW 07/21/2020 0220   APPEARANCEUR HAZY (A) 07/21/2020 0220   LABSPEC 1.028 07/21/2020 0220   PHURINE 5.0 07/21/2020 0220   GLUCOSEU NEGATIVE 07/21/2020 0220   HGBUR SMALL (A) 07/21/2020 0220   BILIRUBINUR NEGATIVE 07/21/2020 0220   BILIRUBINUR negative 10/07/2019 1254   KETONESUR 20 (A) 07/21/2020 0220   PROTEINUR 100 (A) 07/21/2020 0220   UROBILINOGEN 0.2 10/07/2019 1254   NITRITE NEGATIVE 07/21/2020 0220   LEUKOCYTESUR NEGATIVE 07/21/2020 0220   Sepsis Labs: @LABRCNTIP (procalcitonin:4,lacticidven:4) ) Recent Results (from the past 240 hour(s))  Resp Panel by RT-PCR (Flu A&B, Covid) Nasopharyngeal Swab     Status: None   Collection Time: 07/21/20  5:24 AM   Specimen: Nasopharyngeal Swab; Nasopharyngeal(NP) swabs in vial transport medium  Result Value Ref Range Status   SARS Coronavirus 2 by RT PCR NEGATIVE NEGATIVE Final    Comment: (NOTE) SARS-CoV-2 target nucleic acids are NOT DETECTED.  The SARS-CoV-2 RNA is generally detectable in upper respiratory specimens during the acute phase of infection. The lowest concentration of SARS-CoV-2 viral  copies this assay can detect is 138 copies/mL. A negative result does not preclude SARS-Cov-2 infection and should not be used as the sole basis for treatment or other patient management decisions. A negative result may occur with  improper specimen collection/handling, submission of specimen other than nasopharyngeal swab, presence of viral mutation(s) within the areas targeted by this assay, and inadequate number of viral copies(<138 copies/mL). A negative result must be combined with clinical observations, patient history, and epidemiological information. The expected result is Negative.  Fact Sheet for Patients:  07/23/20  Fact Sheet for Healthcare Providers:  BloggerCourse.com  This test is no t yet approved or cleared by the [place] FDA and  has been authorized for detection and/or diagnosis of SARS-CoV-2 by FDA under an Emergency Use Authorization (EUA). This EUA will remain  in effect (meaning this test can be used) for the duration of the COVID-19 declaration under Section 564(b)(1) of the Act, 21 U.S.C.section 360bbb-3(b)(1), unless the authorization is terminated  or  revoked sooner.       Influenza A by PCR NEGATIVE NEGATIVE Final   Influenza B by PCR NEGATIVE NEGATIVE Final    Comment: (NOTE) The Xpert Xpress SARS-CoV-2/FLU/RSV plus assay is intended as an aid in the diagnosis of influenza from Nasopharyngeal swab specimens and should not be used as a sole basis for treatment. Nasal washings and aspirates are unacceptable for Xpert Xpress SARS-CoV-2/FLU/RSV testing.  Fact Sheet for Patients: BloggerCourse.com  Fact Sheet for Healthcare Providers: SeriousBroker.it  This test is not yet approved or cleared by the [place] FDA and has been authorized for detection and/or diagnosis of SARS-CoV-2 by FDA under an Emergency Use Authorization (EUA). This  EUA will remain in effect (meaning this test can be used) for the duration of the COVID-19 declaration under Section 564(b)(1) of the Act, 21 U.S.C. section 360bbb-3(b)(1), unless the authorization is terminated or revoked.  Performed at [facility], [address] [place], [place] [address]      Radiological Exams on Admission: DG Chest 2 View  Result Date: 07/20/2020 CLINICAL DATA:  Rib pain and fever. Patient reports right flank pain after injury at work recently. EXAM: CHEST - 2 VIEW COMPARISON:  Chest radiograph 07/10/2020 FINDINGS: The cardiomediastinal contours are normal. The lungs are clear. Pulmonary vasculature is normal. No consolidation, pleural effusion, or pneumothorax. No acute osseous abnormalities are seen. No visualized rib fractures on this non dedicated rib exam. IMPRESSION: Negative radiographs of the chest. Electronically Signed   By: Narda Rutherford M.D.   On: 07/20/2020 23:44   DG Elbow Complete Left  Result Date: 07/21/2020 CLINICAL DATA:  Severe elbow pain EXAM: LEFT ELBOW - COMPLETE 3+ VIEW COMPARISON:  None. FINDINGS: No fracture or dislocation is seen. The joint spaces are preserved. Visualized soft tissues are within normal limits. No displaced elbow joint fat pads to suggest an elbow joint effusion. IMPRESSION: Negative. Electronically Signed   By: Charline Bills M.D.   On: 07/21/2020 02:42     Assessment/Plan Principal Problem:   Septic joint of left elbow (HCC) Active Problems:   Septic arthritis (HCC)    Septic arthritis of the left elbow -left elbow has been aspirated labs of which are pending.  Patient on empiric antibiotics we will keep patient n.p.o. in anticipation of procedure.  Dr. Shon Baton of orthopedic surgeon has been consulted.  Blood cultures have been obtained. Right-sided chest pain from hitting her furniture.  Chest x-ray unremarkable.  We will continue to observe. Tobacco abuse -advised about quitting.  Since  patient has septic arthritis will need close monitoring for any further worsening and possibly patient may need to be taken to the OR he will need inpatient status.   DVT prophylaxis: SCDs.  In anticipation of procedure holding anticoagulants. Code Status: Full code. Family Communication: Discussed with patient. Disposition Plan: Home. Consults called: Orthopedics. Admission status: Inpatient.   Eduard Clos MD [facility] Pager [phone].  If 7PM-7AM, please contact night-coverage [email or web address] Password [facility]  07/21/2020, 7:41 AM

## 2020-07-21 NOTE — Anesthesia Postprocedure Evaluation (Signed)
Anesthesia Post Note  Patient: [name]  Procedure(s) Performed: INCISION AND DRAINAGE ABSCESS (Left)     Patient location during evaluation: PACU Anesthesia Type: General Level of consciousness: sedated Pain management: pain level controlled Vital Signs Assessment: post-procedure vital signs reviewed and stable Respiratory status: spontaneous breathing and respiratory function stable Cardiovascular status: stable Postop Assessment: no apparent nausea or vomiting Anesthetic complications: no   No notable events documented.  Last Vitals:  Vitals:   07/21/20 1150 07/21/20 1200  BP:  104/67  Pulse: 65 (!) 58  Resp: 10 10  Temp:    SpO2: 97% 98%    Last Pain:  Vitals:   07/21/20 1215  TempSrc:   PainSc: Asleep                 Sheena Simonis DANIEL

## 2020-07-21 NOTE — Progress Notes (Signed)
Orthopedic Tech Progress Note Patient Details:  [name] 04/15/1985 [id number]  Patient ID: [name], male   DOB: 01-13-1985, 35 y.o.   MRN: [id number]  [name] 07/21/2020, 4:24 PM Splint applied in or

## 2020-07-21 NOTE — Consult Note (Signed)
[facility]       Reason for Consult:septic arthritis left elbow    Referring Physician: Dr. Shon Baton  Principal Problem:   Septic joint of left elbow [facility]) Active Problems:   Septic arthritis (HCC)    acetaminophen  500 mg Oral Q6H   docusate sodium  100 mg Oral BID   midazolam       pantoprazole  40 mg Oral Daily    Recommendations:  Vancomycin and ceftriaxone pending cultures Plan for 2-3 weeks of IV antibiotics May need placement since he lives alone vs long picc line port  Assessment: He has a septic arthritis of his elbow now s/p debridement.     Antibiotics: Vancomycin and ceftriaxone  HPI: [name] is a 35 y.o. male with no significant past medical history, no history of joint surgery who presented with about 36 hours of progressive left elbow pain, swelling and decreased range of motion.  He had recently had a right-sided rib injury but no trauma to his elbow.  Seen by Dr. Shon Baton and taken to the OR and joint debrided.  Some purulence was noted.  He did receive antibiotics prior to surgery.     Review of Systems:  Constitutional: negative for fevers and chills Gastrointestinal: negative for nausea and diarrhea Integument/breast: negative for rash All other systems reviewed and are negative    PMH: no significant pmh  Social History   Tobacco Use   Smoking status: Every Day    Types: Cigarettes   Smokeless tobacco: Never  Vaping Use   Vaping Use: Never used  Substance Use Topics   Alcohol use: Yes    Comment: monthly- 1/2   Drug use: Yes    Types: Marijuana    Family History  Problem Relation Age of Onset   Diabetes Mellitus II Maternal Grandmother     Allergies  Allergen Reactions   Fish Allergy Hives and Itching    Physical Exam: Constitutional: in no apparent distress  Vitals:   07/21/20 1330 07/21/20 1342  BP:    Pulse: (!) 58 (!) 58  Resp: 12   Temp: 97.7 F (36.5 C)   SpO2: 99% 100%    EYES: anicteric Cardiovascular: Cor RRR Respiratory: clear; Musculoskeletal: left elbow wrapped Skin: negatives: no rash Neuro: non-focal  Lab Results  Component Value Date   WBC 27.6 (H) 07/20/2020   HGB 16.0 07/20/2020   HCT 47.3 07/20/2020   MCV 95.6 07/20/2020   PLT 311 07/20/2020    Lab Results  Component Value Date   CREATININE 0.94 07/20/2020   BUN 13 07/20/2020   NA 134 (L) 07/20/2020   K 3.9 07/20/2020   CL 101 07/20/2020   CO2 23 07/20/2020    Lab Results  Component Value Date   ALT 27 07/20/2020   AST 28 07/20/2020   ALKPHOS 72 07/20/2020     Microbiology: Recent Results (from the past 240 hour(s))  Body fluid culture w Gram Stain     Status: None (Preliminary result)   Collection Time: 07/21/20  3:59 AM   Specimen: Synovium; Body Fluid  Result Value Ref Range Status   Specimen Description   Final    SYNOVIAL LT ELBOW Performed at [facility], [address] [place], [place] [address]    Special Requests   Final    NONE Performed at [facility], [address], [place], [place] [address]    Gram Stain   Final  FEW WBC PRESENT,BOTH PMN AND MONONUCLEAR NO ORGANISMS SEEN Performed at [facility], [address], [place], [place] [address]    Culture PENDING  Incomplete   Report Status PENDING  Incomplete  Resp Panel by RT-PCR (Flu A&B, Covid) Nasopharyngeal Swab     Status: None   Collection Time: 07/21/20  5:24 AM   Specimen: Nasopharyngeal Swab; Nasopharyngeal(NP) swabs in vial transport medium  Result Value Ref Range Status   SARS Coronavirus 2 by RT PCR NEGATIVE NEGATIVE Final    Comment: (NOTE) SARS-CoV-2 target nucleic acids are NOT DETECTED.  The SARS-CoV-2 RNA is generally detectable in upper respiratory specimens during the acute phase of infection. The lowest concentration of SARS-CoV-2 viral copies this assay can detect is 138 copies/mL. A negative result does not preclude  SARS-Cov-2 infection and should not be used as the sole basis for treatment or other patient management decisions. A negative result may occur with  improper specimen collection/handling, submission of specimen other than nasopharyngeal swab, presence of viral mutation(s) within the areas targeted by this assay, and inadequate number of viral copies(<138 copies/mL). A negative result must be combined with clinical observations, patient history, and epidemiological information. The expected result is Negative.  Fact Sheet for Patients:  BloggerCourse.com  Fact Sheet for Healthcare Providers:  SeriousBroker.it  This test is no t yet approved or cleared by the [place] FDA and  has been authorized for detection and/or diagnosis of SARS-CoV-2 by FDA under an Emergency Use Authorization (EUA). This EUA will remain  in effect (meaning this test can be used) for the duration of the COVID-19 declaration under Section 564(b)(1) of the Act, 21 U.S.C.section 360bbb-3(b)(1), unless the authorization is terminated  or revoked sooner.       Influenza A by PCR NEGATIVE NEGATIVE Final   Influenza B by PCR NEGATIVE NEGATIVE Final    Comment: (NOTE) The Xpert Xpress SARS-CoV-2/FLU/RSV plus assay is intended as an aid in the diagnosis of influenza from Nasopharyngeal swab specimens and should not be used as a sole basis for treatment. Nasal washings and aspirates are unacceptable for Xpert Xpress SARS-CoV-2/FLU/RSV testing.  Fact Sheet for Patients: BloggerCourse.com  Fact Sheet for Healthcare Providers: SeriousBroker.it  This test is not yet approved or cleared by the [place] FDA and has been authorized for detection and/or diagnosis of SARS-CoV-2 by FDA under an Emergency Use Authorization (EUA). This EUA will remain in effect (meaning this test can be used) for the duration of  the COVID-19 declaration under Section 564(b)(1) of the Act, 21 U.S.C. section 360bbb-3(b)(1), unless the authorization is terminated or revoked.  Performed at [facility], [address], [place], [place] [address]   Aerobic/Anaerobic Culture w Gram Stain (surgical/deep wound)     Status: None (Preliminary result)   Collection Time: 07/21/20 10:37 AM   Specimen: PATH Cytology Misc. fluid; Body Fluid  Result Value Ref Range Status   Specimen Description   Final    ABSCESS LEFT ELBOW Performed at [facility], [address], [place], [place] [address]    Special Requests   Final    NONE Performed at [facility], [address] [place], [place] [address]    Gram Stain   Final    MODERATE WBC PRESENT,BOTH PMN AND MONONUCLEAR Performed at [facility], [address], [place], [place] [address]    Culture PENDING  Incomplete   Report Status PENDING  Incomplete    Molly Maduro  Jackolyn Confer, MD [facility] [email or web address] 07/21/2020, 2:47 PM

## 2020-07-22 ENCOUNTER — Encounter (HOSPITAL_COMMUNITY)
Admission: EM | Disposition: A | Discharge status: Home / Self Care | Payer: Self-pay | Source: Home / Self Care | Attending: Internal Medicine

## 2020-07-22 ENCOUNTER — Encounter (HOSPITAL_COMMUNITY): Payer: Self-pay | Admitting: Orthopedic Surgery

## 2020-07-22 ENCOUNTER — Observation Stay: Payer: Self-pay

## 2020-07-22 DIAGNOSIS — M009 Pyogenic arthritis, unspecified: Secondary | ICD-10-CM | POA: Diagnosis present

## 2020-07-22 DIAGNOSIS — A409 Streptococcal sepsis, unspecified: Secondary | ICD-10-CM

## 2020-07-22 DIAGNOSIS — M00222 Other streptococcal arthritis, left elbow: Principal | ICD-10-CM

## 2020-07-22 LAB — BASIC METABOLIC PANEL
Anion gap: 6 (ref 5–15)
BUN: 15 mg/dL (ref 6–20)
CO2: 29 mmol/L (ref 22–32)
Calcium: 8.9 mg/dL (ref 8.9–10.3)
Chloride: 104 mmol/L (ref 98–111)
Creatinine, Ser: 0.85 mg/dL (ref 0.61–1.24)
GFR, Estimated: >60 mL/min (ref >60)
Glucose, Bld: 135 mg/dL — ABNORMAL HIGH (ref 70–99)
Potassium: 4.2 mmol/L (ref 3.5–5.1)
Sodium: 139 mmol/L (ref 135–145)

## 2020-07-22 LAB — CBC
HCT: 44.4 % (ref 39.0–52.0)
Hemoglobin: 14.7 g/dL (ref 13.0–17.0)
MCH: 31.7 pg (ref 26.0–34.0)
MCHC: 33.1 g/dL (ref 30.0–36.0)
MCV: 95.7 fL (ref 80.0–100.0)
Platelets: 278 10*3/uL (ref 150–400)
RBC: 4.64 MIL/uL (ref 4.22–5.81)
RDW: 12.4 % (ref 11.5–15.5)
WBC: 27.8 10*3/uL — ABNORMAL HIGH (ref 4.0–10.5)
nRBC: 0 % (ref 0.0–0.2)

## 2020-07-22 SURGERY — HEMIARTHROPLASTY, HIP, DIRECT ANTERIOR APPROACH, FOR FRACTURE
Anesthesia: Choice | Laterality: Left

## 2020-07-22 MED ORDER — SODIUM CHLORIDE 0.9% FLUSH: 10.0000 mL | INTRAVENOUS | Status: DC | PRN

## 2020-07-22 MED ORDER — PENICILLIN G POTASSIUM 20000000 UNITS IJ SOLR
12.0000 10*6.[IU] | Freq: Two times a day (BID) | INTRAMUSCULAR | Status: DC
  Administered 2020-07-22 – 2020-08-03 (×23): 12 10*6.[IU] via INTRAVENOUS
  Filled 2020-07-22 (×27): qty 12

## 2020-07-22 MED ORDER — SODIUM CHLORIDE 0.9% FLUSH
10.0000 mL | Freq: Two times a day (BID) | INTRAVENOUS | Status: DC
  Administered 2020-07-22 – 2020-08-01 (×12): 10 mL

## 2020-07-22 MED ORDER — KETOROLAC TROMETHAMINE 30 MG/ML IJ SOLN
30.0000 mg | Freq: Three times a day (TID) | INTRAMUSCULAR | Status: AC | PRN
  Administered 2020-07-22 – 2020-07-23 (×3): 30 mg via INTRAVENOUS
  Filled 2020-07-22 (×3): qty 1

## 2020-07-22 MED ORDER — LIDOCAINE 5 % EX PTCH
1.0000 | MEDICATED_PATCH | CUTANEOUS | Status: DC
  Administered 2020-07-22 – 2020-07-23 (×2): 1 via TRANSDERMAL
  Filled 2020-07-22 (×13): qty 1

## 2020-07-22 NOTE — Progress Notes (Addendum)
[facility]   Reason for visit: Follow up on septic arthritis left elbow  Interval History: no significant issues, WBC stable at 27.8, afebrile > 24 hours.  No associated rash or diarrhea.   Operative cultures: Group A Strep  Physical Exam: Constitutional:  Vitals:   07/22/20 0900 07/22/20 1135  BP: 126/68   Pulse: (!) 57   Resp: 16   Temp:  98.1 F (36.7 C)  SpO2: 97%    patient appears in NAD Respiratory: Normal respiratory effort; CTA B Cardiovascular: RRR GI: soft, nt, nd MS: left elbow wrapped  Review of Systems: Constitutional: negative for fevers and chills Gastrointestinal: negative for nausea and diarrhea Integument/breast: negative for rash  Lab Results  Component Value Date   WBC 27.8 (H) 07/22/2020   HGB 14.7 07/22/2020   HCT 44.4 07/22/2020   MCV 95.7 07/22/2020   PLT 278 07/22/2020    Lab Results  Component Value Date   CREATININE 0.85 07/22/2020   BUN 15 07/22/2020   NA 139 07/22/2020   K 4.2 07/22/2020   CL 104 07/22/2020   CO2 29 07/22/2020    Lab Results  Component Value Date   ALT 27 07/20/2020   AST 28 07/20/2020   ALKPHOS 72 07/20/2020     Microbiology: Recent Results (from the past 240 hour(s))  Body fluid culture w Gram Stain     Status: None (Preliminary result)   Collection Time: 07/21/20  3:59 AM   Specimen: Synovium; Body Fluid  Result Value Ref Range Status   Specimen Description   Final    SYNOVIAL LT ELBOW Performed at [place] [address] [place], [place] [address]    Special Requests   Final    NONE Performed at [facility], [place] [address], [place], [place] [address]    Gram Stain   Final    FEW WBC PRESENT,BOTH PMN AND MONONUCLEAR NO ORGANISMS SEEN    Culture   Final    RARE GROUP A STREP (S.PYOGENES) ISOLATED SUSCEPTIBILITIES TO FOLLOW CRITICAL RESULT CALLED TO, READ BACK BY AND VERIFIED WITH: PHARMD T GREEN [id number] AT 1033 BY  CM Performed at [facility], [place] [address] [place], [place] [address]    Report Status PENDING  Incomplete  Resp Panel by RT-PCR (Flu A&B, Covid) Nasopharyngeal Swab     Status: None   Collection Time: 07/21/20  5:24 AM   Specimen: Nasopharyngeal Swab; Nasopharyngeal(NP) swabs in vial transport medium  Result Value Ref Range Status   SARS Coronavirus 2 by RT PCR NEGATIVE NEGATIVE Final    Comment: (NOTE) SARS-CoV-2 target nucleic acids are NOT DETECTED.  The SARS-CoV-2 RNA is generally detectable in upper respiratory specimens during the acute phase of infection. The lowest concentration of SARS-CoV-2 viral copies this assay can detect is 138 copies/mL. A negative result does not preclude SARS-Cov-2 infection and should not be used as the sole basis for treatment or other patient management decisions. A negative result may occur with  improper specimen collection/handling, submission of specimen other than nasopharyngeal swab, presence of viral mutation(s) within the areas targeted by this assay, and inadequate number of viral copies(<138 copies/mL). A negative result must be combined with clinical observations, patient history, and epidemiological information. The expected result is Negative.  Fact Sheet for Patients:  EntrepreneurPulse.com.au  Fact Sheet for Healthcare Providers:  IncredibleEmployment.be  This test is no t yet approved or cleared by the [place] FDA  and  has been authorized for detection and/or diagnosis of SARS-CoV-2 by FDA under an Emergency Use Authorization (EUA). This EUA will remain  in effect (meaning this test can be used) for the duration of the COVID-19 declaration under Section 564(b)(1) of the Act, 21 U.S.C.section 360bbb-3(b)(1), unless the authorization is terminated  or revoked sooner.       Influenza A by PCR NEGATIVE NEGATIVE Final   Influenza B by PCR NEGATIVE NEGATIVE Final    Comment:  (NOTE) The Xpert Xpress SARS-CoV-2/FLU/RSV plus assay is intended as an aid in the diagnosis of influenza from Nasopharyngeal swab specimens and should not be used as a sole basis for treatment. Nasal washings and aspirates are unacceptable for Xpert Xpress SARS-CoV-2/FLU/RSV testing.  Fact Sheet for Patients: EntrepreneurPulse.com.au  Fact Sheet for Healthcare Providers: IncredibleEmployment.be  This test is not yet approved or cleared by the [place] FDA and has been authorized for detection and/or diagnosis of SARS-CoV-2 by FDA under an Emergency Use Authorization (EUA). This EUA will remain in effect (meaning this test can be used) for the duration of the COVID-19 declaration under Section 564(b)(1) of the Act, 21 U.S.C. section 360bbb-3(b)(1), unless the authorization is terminated or revoked.  Performed at [facility], [place] [address], [place], [place] [address]   Aerobic/Anaerobic Culture w Gram Stain (surgical/deep wound)     Status: None (Preliminary result)   Collection Time: 07/21/20 10:37 AM   Specimen: PATH Cytology Misc. fluid; Body Fluid  Result Value Ref Range Status   Specimen Description   Final    ABSCESS LEFT ELBOW Performed at [place] [address], [place], [place] [address]    Special Requests   Final    NONE Performed at [facility], [place] [address], [place], [place] [address]    Gram Stain MODERATE WBC PRESENT,BOTH PMN AND MONONUCLEAR  Final   Culture   Final    NO GROWTH < 24 HOURS Performed at [facility], [place] [address] [place], [place] [address]    Report Status PENDING  Incomplete  MRSA Next Gen by PCR, Nasal     Status: None   Collection Time: 07/21/20  5:50 PM   Specimen: Nasal Mucosa; Nasal Swab  Result Value Ref Range Status   MRSA by PCR Next Gen NOT DETECTED NOT DETECTED Final    Comment: (NOTE) The GeneXpert MRSA Assay  (FDA approved for NASAL specimens only), is one component of a comprehensive MRSA colonization surveillance program. It is not intended to diagnose MRSA infection nor to guide or monitor treatment for MRSA infections. Test performance is not FDA approved in patients less than 87 years old. Performed at [facility], [place] [address] [place], [place] [address]     Impression/Plan:  1. Septic arthritis right elbow - growth noted and positive for group A Strep and will transition him to IV penicillin.  Plan for 2 weeks of IV penicillin at home followed by oral amoxicillin  2.  Access - will order picc line  3.  OPAT - will order home health for IV penicillin through August 1st, 2022.    OK from ID standpoint for discharge  Diagnosis: Left elbow septic arthritis  Culture Result: GAS  Allergies  Allergen Reactions   Fish Allergy Hives and Itching    OPAT Orders Discharge antibiotics to be given via PICC line Discharge antibiotics: IV penicillin 24 million units per 24 hours Per pharmacy protocol yes Duration:  2 weeks total antibiotics post op End Date: 08/03/20  [facility] Care Per Protocol: yes  Home health RN for IV administration and teaching; PICC line care and labs.    Labs weekly while on IV antibiotics: _x_ CBC with differential __ BMP __x CMP __ CRP __ ESR __ Vancomycin trough __ CK  __x Please pull PIC at completion of IV antibiotics __ Please leave PIC in place until doctor has seen patient or been notified  Fax weekly labs to [phone]  Clinic Follow Up Appt: 08/03/20  @ 11:15 with Dr. Linus Salmons

## 2020-07-22 NOTE — Progress Notes (Addendum)
PHARMACY CONSULT NOTE FOR:  OUTPATIENT  PARENTERAL ANTIBIOTIC THERAPY (OPAT)  Indication: Septic arthritis of left elbow Regimen: Penicillin G 24 million units daily  End date: 08/03/20  IV antibiotic discharge orders are pended. To discharging provider:  please sign these orders via discharge navigator,  Select New Orders & click on the button choice - Manage This Unsigned Work.     Thank you for allowing pharmacy to be a part of this patient's care.  Andree Coss, PharmD Candidate 07/22/2020, 12:19 PM   Sharin Mons, PharmD, BCPS, BCIDP Infectious Diseases Clinical Pharmacist Phone: [phone]

## 2020-07-22 NOTE — Progress Notes (Signed)
Peripherally Inserted Central Catheter Placement  The IV Nurse has discussed with the patient and/or persons authorized to consent for the patient, the purpose of this procedure and the potential benefits and risks involved with this procedure.  The benefits include less needle sticks, lab draws from the catheter, and the patient may be discharged home with the catheter. Risks include, but not limited to, infection, bleeding, blood clot (thrombus formation), and puncture of an artery; nerve damage and irregular heartbeat and possibility to perform a PICC exchange if needed/ordered by physician.  Alternatives to this procedure were also discussed.  Bard Power PICC patient education guide, fact sheet on infection prevention and patient information card has been provided to patient /or left at bedside.    PICC Placement Documentation  PICC Single Lumen 07/22/20 Right Brachial 39 cm 0 cm (Active)  Indication for Insertion or Continuance of Line Prolonged intravenous therapies 07/22/20 1614  Exposed Catheter (cm) 0 cm 07/22/20 1614  Site Assessment Clean;Dry;Intact 07/22/20 1614  Line Status Flushed;Blood return noted 07/22/20 1614  Dressing Type Transparent 07/22/20 1614  Dressing Status Clean;Dry;Intact 07/22/20 1614  Antimicrobial disc in place? Yes 07/22/20 1614  Dressing Intervention New dressing;Other (Comment) 07/22/20 1614  Dressing Change Due 07/29/20 07/22/20 1614       Reginia Forts Albarece 07/22/2020, 4:16 PM

## 2020-07-22 NOTE — Progress Notes (Signed)
PROGRESS NOTE  [name] KXF:[id number] DOB: 05/04/1985 DOA: 07/20/2020 PCP: Patient, No Pcp Per (Inactive)  HPI/Recap of past 31 hours: 35 year old male without any major past medical history presented to the emergency room on the morning of 7/19 with 36-hour history of left elbow pain and swelling.  Ultrasound noted purulent material.  Admitted to the hospital service for septic arthritis of left elbow joint.  Orthopedic surgery took patient to the OR later that morning for I&D with culture sent.  Patient initially placed in stepdown unit.  This morning, patient is comfortably sitting in chair, stable vitals.  White blood cell count earlier this morning at 27.8, little change from previous day.  Patient complains of elbow soreness although improved from previous day.  Cultures back for Group A strep.  Also complains of some right-sided rib pain.  Assessment/Plan: Principal Problem:   Septic joint of left elbow [facility]): Continue antibiotics, changed to IV penicillin given stable vitals, transfer out of ICU.  Cultures identifies group A strep.  Cleared by orthopedic surgery who will follow patient in office in 1 week.  PICC line placed.  Plan will be for 2 weeks of IV penicillin followed by oral therapy.  Infectious disease will follow   Code Status: Full code  Family Communication: Fianc on phone  Disposition Plan: Transfer out of stepdown today.  Anticipate potential discharge tomorrow as long as white blood cell count improves significantly.   Consultants: Orthopedic surgery Infectious disease  Procedures: Status post I&D of left elbow abscess 7/19 Placement of PICC line 7/12  Antimicrobials: IV Rocephin and vancomycin 7/19-7/20 IV penicillin 7/20-present  DVT prophylaxis: SCDs  Level of care: Stepdown   Objective: Vitals:   07/22/20 0500 07/22/20 0740  BP: 102/73   Pulse: (!) 49   Resp: 14   Temp:  97.6 F (36.4 C)  SpO2: 96%     Intake/Output Summary  (Last 24 hours) at 07/22/2020 0838 Last data filed at 07/22/2020 0730 Gross per 24 hour  Intake 1978 ml  Output 1200 ml  Net 778 ml   Filed Weights   07/21/20 0753  Weight: 66 kg   Body mass index is 24.21 kg/m.  Exam:  General: Alert and oriented x3, no acute distress Cardiovascular: Regular rate and rhythm, S1-S2 Respiratory: Clear to auscultation bilaterally Abdomen: Soft, nontender, nondistended, positive bowel sounds Musculoskeletal: Left upper extremity wrapped Skin: No skin breaks, tears or lesions.  Noted tattoos Psychiatry: Appropriate, no evidence of psychoses Neurology: No focal deficits   Data Reviewed: CBC: Recent Labs  Lab 07/20/20 2330 07/22/20 0259  WBC 27.6* 27.8*  NEUTROABS 24.0*  --   HGB 16.0 14.7  HCT 47.3 44.4  MCV 95.6 95.7  PLT 311 278   Basic Metabolic Panel: Recent Labs  Lab 07/20/20 2330 07/22/20 0259  NA 134* 139  K 3.9 4.2  CL 101 104  CO2 23 29  GLUCOSE 99 135*  BUN 13 15  CREATININE 0.94 0.85  CALCIUM 9.4 8.9   GFR: Estimated Creatinine Clearance: 106.5 mL/min (by C-G formula based on SCr of 0.85 mg/dL). Liver Function Tests: Recent Labs  Lab 07/20/20 2330  AST 28  ALT 27  ALKPHOS 72  BILITOT 1.2  PROT 8.1  ALBUMIN 3.9   No results for input(s): LIPASE, AMYLASE in the last 168 hours. No results for input(s): AMMONIA in the last 168 hours. Coagulation Profile: No results for input(s): INR, PROTIME in the last 168 hours. Cardiac Enzymes: No results for input(s): CKTOTAL,  CKMB, CKMBINDEX, TROPONINI in the last 168 hours. BNP (last 3 results) No results for input(s): PROBNP in the last 8760 hours. HbA1C: No results for input(s): HGBA1C in the last 72 hours. CBG: No results for input(s): GLUCAP in the last 168 hours. Lipid Profile: No results for input(s): CHOL, HDL, LDLCALC, TRIG, CHOLHDL, LDLDIRECT in the last 72 hours. Thyroid Function Tests: No results for input(s): TSH, T4TOTAL, FREET4, T3FREE, THYROIDAB in  the last 72 hours. Anemia Panel: No results for input(s): VITAMINB12, FOLATE, FERRITIN, TIBC, IRON, RETICCTPCT in the last 72 hours. Urine analysis:    Component Value Date/Time   COLORURINE YELLOW 07/21/2020 0220   APPEARANCEUR HAZY (A) 07/21/2020 0220   LABSPEC 1.028 07/21/2020 0220   PHURINE 5.0 07/21/2020 0220   GLUCOSEU NEGATIVE 07/21/2020 0220   HGBUR SMALL (A) 07/21/2020 0220   BILIRUBINUR NEGATIVE 07/21/2020 0220   BILIRUBINUR negative 10/07/2019 1254   KETONESUR 20 (A) 07/21/2020 0220   PROTEINUR 100 (A) 07/21/2020 0220   UROBILINOGEN 0.2 10/07/2019 1254   NITRITE NEGATIVE 07/21/2020 0220   LEUKOCYTESUR NEGATIVE 07/21/2020 0220   Sepsis Labs: @LABRCNTIP (procalcitonin:4,lacticidven:4)  ) Recent Results (from the past 240 hour(s))  Body fluid culture w Gram Stain     Status: None (Preliminary result)   Collection Time: 07/21/20  3:59 AM   Specimen: Synovium; Body Fluid  Result Value Ref Range Status   Specimen Description   Final    SYNOVIAL LT ELBOW Performed at [facility], [address], [place], [place]    Special Requests   Final    NONE Performed at [facility], [address] [place], [place]    Gram Stain   Final    FEW WBC PRESENT,BOTH PMN AND MONONUCLEAR NO ORGANISMS SEEN    Culture   Final    CULTURE REINCUBATED FOR BETTER GROWTH Performed at [facility], [address] [place], [place]    Report Status PENDING  Incomplete  Resp Panel by RT-PCR (Flu A&B, Covid) Nasopharyngeal Swab     Status: None   Collection Time: 07/21/20  5:24 AM   Specimen: Nasopharyngeal Swab; Nasopharyngeal(NP) swabs in vial transport medium  Result Value Ref Range Status   SARS Coronavirus 2 by RT PCR NEGATIVE NEGATIVE Final    Comment: (NOTE) SARS-CoV-2 target nucleic acids are NOT DETECTED.  The SARS-CoV-2 RNA is generally detectable in upper respiratory specimens during the acute phase of  infection. The lowest concentration of SARS-CoV-2 viral copies this assay can detect is 138 copies/mL. A negative result does not preclude SARS-Cov-2 infection and should not be used as the sole basis for treatment or other patient management decisions. A negative result may occur with  improper specimen collection/handling, submission of specimen other than nasopharyngeal swab, presence of viral mutation(s) within the areas targeted by this assay, and inadequate number of viral copies(<138 copies/mL). A negative result must be combined with clinical observations, patient history, and epidemiological information. The expected result is Negative.  Fact Sheet for Patients:  07/23/20  Fact Sheet for Healthcare Providers:  BloggerCourse.com  This test is no t yet approved or cleared by the [place] FDA and  has been authorized for detection and/or diagnosis of SARS-CoV-2 by FDA under an Emergency Use Authorization (EUA). This EUA will remain  in effect (meaning this test can be used) for the duration of the COVID-19 declaration under Section 564(b)(1) of the Act, 21 U.S.C.section 360bbb-3(b)(1), unless the authorization is terminated  or revoked sooner.       Influenza A by PCR NEGATIVE NEGATIVE Final   Influenza B by PCR NEGATIVE NEGATIVE Final    Comment: (NOTE) The Xpert Xpress SARS-CoV-2/FLU/RSV plus assay is intended as an aid in the diagnosis of influenza from Nasopharyngeal swab specimens and should not be used as a sole basis for treatment. Nasal washings and aspirates are unacceptable for Xpert Xpress SARS-CoV-2/FLU/RSV testing.  Fact Sheet for Patients: BloggerCourse.com  Fact Sheet for Healthcare Providers: SeriousBroker.it  This test is not yet approved or cleared by the [place] FDA and has been authorized for detection and/or diagnosis of SARS-CoV-2  by FDA under an Emergency Use Authorization (EUA). This EUA will remain in effect (meaning this test can be used) for the duration of the COVID-19 declaration under Section 564(b)(1) of the Act, 21 U.S.C. section 360bbb-3(b)(1), unless the authorization is terminated or revoked.  Performed at [facility], [address], [place], [place] [address]   Aerobic/Anaerobic Culture w Gram Stain (surgical/deep wound)     Status: None (Preliminary result)   Collection Time: 07/21/20 10:37 AM   Specimen: PATH Cytology Misc. fluid; Body Fluid  Result Value Ref Range Status   Specimen Description   Final    ABSCESS LEFT ELBOW Performed at [facility], [address] [place], [place] [address]    Special Requests   Final    NONE Performed at [facility], [address], [place], [place] [address]    Gram Stain   Final    MODERATE WBC PRESENT,BOTH PMN AND MONONUCLEAR Performed at [facility], [address] [place], [place] [address]    Culture PENDING  Incomplete   Report Status PENDING  Incomplete  MRSA Next Gen by PCR, Nasal     Status: None   Collection Time: 07/21/20  5:50 PM   Specimen: Nasal Mucosa; Nasal Swab  Result Value Ref Range Status   MRSA by PCR Next Gen NOT DETECTED NOT DETECTED Final    Comment: (NOTE) The GeneXpert MRSA Assay (FDA approved for NASAL specimens only), is one component of a comprehensive MRSA colonization surveillance program. It is not intended to diagnose MRSA infection nor to guide or monitor treatment for MRSA infections. Test performance is not FDA approved in patients less than 45 years old. Performed at [facility], [address], [place], [place] [address]       Studies: No results found.  Scheduled Meds:  acetaminophen  500 mg Oral Q6H   Chlorhexidine Gluconate Cloth  6 each Topical Daily   docusate sodium  100 mg Oral BID   pantoprazole  40 mg Oral  Daily    Continuous Infusions:  cefTRIAXone (ROCEPHIN)  IV 2 g (07/22/20 6578)   lactated ringers 75 mL/hr at 07/22/20 0813   methocarbamol (ROBAXIN) IV     vancomycin 1,000 mg (07/22/20 0814)     LOS: 1 day     Hollice Espy, MD [facility]   07/22/2020, 8:38 AM

## 2020-07-22 NOTE — Progress Notes (Addendum)
Subjective: 1 Day Post-Op Procedure(s) (LRB): INCISION AND DRAINAGE ABSCESS (Left) Patient reports pain as mild.  Elbow pain improving. Main complain is right sided rib pain.  Does report sweats and chills overnight, but no recorded temp.  Tolerating PO without N/V +void, feels like he is emptying bladder fully. +flatus  Objective: Vital signs in last 24 hours: Temp:  [94.2 F (34.6 C)-97.9 F (36.6 C)] 97.6 F (36.4 C) (07/20 0740) Pulse Rate:  [45-83] 56 (07/20 0800) Resp:  [10-20] 20 (07/20 0800) BP: (94-131)/(48-80) 125/80 (07/20 0800) SpO2:  [95 %-100 %] 95 % (07/20 0800)  Intake/Output from previous day: 07/19 0701 - 07/20 0700 In: 1978 [P.O.:120; I.V.:1658; IV Piggyback:200] Out: 950 [Urine:950] Intake/Output this shift: Total I/O In: -  Out: 250 [Urine:250]  Recent Labs    07/20/20 2330 07/22/20 0259  HGB 16.0 14.7   Recent Labs    07/20/20 2330 07/22/20 0259  WBC 27.6* 27.8*  RBC 4.95 4.64  HCT 47.3 44.4  PLT 311 278   Recent Labs    07/20/20 2330 07/22/20 0259  NA 134* 139  K 3.9 4.2  CL 101 104  CO2 23 29  BUN 13 15  CREATININE 0.94 0.85  GLUCOSE 99 135*  CALCIUM 9.4 8.9   No results for input(s): LABPT, INR in the last 72 hours.  AAOx3. Patient in splint and sling. Able to move his fingers. Sensation of fingers in tact. Good grip strength. Cap refill <2sec   Assessment/Plan: 1 Day Post-Op Procedure(s) (LRB): INCISION AND DRAINAGE ABSCESS (Left) Septic left elbow 1 day s/p I&D, elbow pain improved. No fever over night.  White count stable. Cultures pending. On ceftriaxone and vac per ID. May need PICC placement for OPAT. Will await updated recs from ID. Transition of care already consulted.  Pain currently controlled on current meds. Will add topical lidocaine patch for rib pain.  Stable from ortho standpoint. Continue splint and sling. We will see patient in our office 1 week after discharge.    [name] 07/22/2020, 9:27  AM

## 2020-07-22 NOTE — TOC Initial Note (Signed)
Transition of Care [facility]) - Initial/Assessment Note    Patient Details  Name: [name] MRN: [id number] Date of Birth: January 24, 1985  Transition of Care [facility] CM/SW Contact:    Golda Acre, RN Phone Number: 07/22/2020, 8:39 AM  Clinical Narrative:                 PRE-OPERATIVE DIAGNOSIS:  INFECTED RIGHT ELBOW   POST-OPERATIVE DIAGNOSIS:  INFECTED RIGHT ELBOW   PROCEDURE:  Procedure(s): INCISION AND DRAINAGE ABSCESS (Left) Procedure date 683419, pod 1 TOC PLAN OF CARE: following for possible hhc needs and progression. Expected Discharge Plan: Home w Home Health Services Barriers to Discharge: Continued Medical Work up   Patient Goals and CMS Choice Patient states their goals for this hospitalization and ongoing recovery are:: to go home      Expected Discharge Plan and Services Expected Discharge Plan: Home w Home Health Services   Discharge Planning Services: CM Consult   Living arrangements for the past 2 months: Single Family Home                                      Prior Living Arrangements/Services Living arrangements for the past 2 months: Single Family Home Lives with:: Significant Other Patient language and need for interpreter reviewed:: Yes Do you feel safe going back to the place where you live?: Yes            Criminal Activity/Legal Involvement Pertinent to Current Situation/Hospitalization: No - Comment as needed  Activities of Daily Living Home Assistive Devices/Equipment: None ADL Screening (condition at time of admission) Patient's cognitive ability adequate to safely complete daily activities?: Yes Is the patient deaf or have difficulty hearing?: No Does the patient have difficulty seeing, even when wearing glasses/contacts?: No Does the patient have difficulty concentrating, remembering, or making decisions?: No Patient able to express need for assistance with ADLs?: Yes Does the patient have difficulty dressing or  bathing?: Yes Independently performs ADLs?: No Communication: Needs assistance Dressing (OT): Independent Grooming: Needs assistance Is this a change from baseline?: Change from baseline, expected to last >3 days Feeding: Needs assistance Is this a change from baseline?: Change from baseline, expected to last >3 days Bathing: Needs assistance Is this a change from baseline?: Change from baseline, expected to last >3 days Toileting: Needs assistance Is this a change from baseline?: Change from baseline, expected to last >3days In/Out Bed: Needs assistance Is this a change from baseline?: Change from baseline, expected to last >3 days Walks in Home: Needs assistance Is this a change from baseline?: Change from baseline, expected to last >3 days Does the patient have difficulty walking or climbing stairs?: No Weakness of Legs: Both Weakness of Arms/Hands: Left  Permission Sought/Granted                  Emotional Assessment Appearance:: Appears stated age Attitude/Demeanor/Rapport: Engaged Affect (typically observed): Calm Orientation: : Oriented to Place, Oriented to Self, Oriented to  Time, Oriented to Situation Alcohol / Substance Use: Not Applicable Psych Involvement: No (comment)  Admission diagnosis:  Septic arthritis (HCC) [M00.9] Septic joint of left elbow (HCC) [M00.9] Arthralgia of left elbow [M25.522] Fever, unspecified fever cause [R50.9] Leukocytosis, unspecified type [D72.829] Patient Active Problem List   Diagnosis Date Noted   Septic joint of left elbow (HCC) 07/21/2020   Septic arthritis (HCC) 07/21/2020   Dysuria 10/07/2019   Routine screening for  STI (sexually transmitted infection) 10/07/2019   Tinea pedis 10/07/2019   PCP:  Patient, No Pcp Per (Inactive) Pharmacy:   [facility] [phone] - [place], [place] - [address] AT [facility] [address] [place] [address] Phone: [phone] Fax:  [phone]  [id number] [place], [place] - [address] AT [facility] OF [facility] [address] [place] [address] Phone: [phone] Fax: [phone]     Social Determinants of Health (SDOH) Interventions    Readmission Risk Interventions No flowsheet data found.

## 2020-07-23 ENCOUNTER — Other Ambulatory Visit: Payer: Self-pay

## 2020-07-23 LAB — BASIC METABOLIC PANEL
Anion gap: 9 (ref 5–15)
BUN: 16 mg/dL (ref 6–20)
CO2: 29 mmol/L (ref 22–32)
Calcium: 8.9 mg/dL (ref 8.9–10.3)
Chloride: 103 mmol/L (ref 98–111)
Creatinine, Ser: 0.74 mg/dL (ref 0.61–1.24)
GFR, Estimated: >60 mL/min (ref >60)
Glucose, Bld: 86 mg/dL (ref 70–99)
Potassium: 3.7 mmol/L (ref 3.5–5.1)
Sodium: 141 mmol/L (ref 135–145)

## 2020-07-23 LAB — BODY FLUID CULTURE W GRAM STAIN

## 2020-07-23 LAB — CBC
HCT: 39.6 % (ref 39.0–52.0)
Hemoglobin: 13.2 g/dL (ref 13.0–17.0)
MCH: 31.9 pg (ref 26.0–34.0)
MCHC: 33.3 g/dL (ref 30.0–36.0)
MCV: 95.7 fL (ref 80.0–100.0)
Platelets: 293 10*3/uL (ref 150–400)
RBC: 4.14 MIL/uL — ABNORMAL LOW (ref 4.22–5.81)
RDW: 12.6 % (ref 11.5–15.5)
WBC: 15.2 10*3/uL — ABNORMAL HIGH (ref 4.0–10.5)
nRBC: 0 % (ref 0.0–0.2)

## 2020-07-23 NOTE — Progress Notes (Signed)
PROGRESS NOTE  [name] ZOX:[id number] DOB: [birth year] DOA: 07/20/2020 PCP: Patient, No Pcp Per (Inactive)  HPI/Recap of past 21 hours: 35 year old male without any major past medical history presented to the emergency room on the morning of 7/19 with 36-hour history of left elbow pain and swelling.  Ultrasound noted purulent material.  Admitted to the hospital service for septic arthritis of left elbow joint.  Orthopedic surgery took patient to the OR later that morning for I&D with culture sent.  Patient initially placed in stepdown unit.  Cultures came back for group A strep and patient changed to IV penicillin, with infectious disease recommending 2 weeks of IV therapy.  PICC line placed.  This morning, patient feeling a little bit better.  Less pain.  White blood cell count down to 15.2.  Assessment/Plan: Principal Problem:   Septic joint of left elbow [facility]): Cultures for group A strep.  2 weeks of IV penicillin followed by oral therapy.  Cleared by orthopedic surgery.  New issue has arisen and that patient has nowhere to go.  Apparently was staying with a friend and that is not an option any longer.  Unable to go to a homeless shelter due to refrigeration needed of IV antibiotics.  White blood cell count continues to improve.   Code Status: Full code  Family Communication: Fianc on phone  Disposition Plan: Location for safe discharge appears to be barrier.  Will discuss with infectious disease to see if we have other options as far as therapy goes   Consultants: Orthopedic surgery Infectious disease  Procedures: Status post I&D of left elbow abscess 7/19 Placement of PICC line 7/20  Antimicrobials: IV Rocephin and vancomycin 7/19-7/20 IV penicillin 7/20-present  DVT prophylaxis: SCDs  Level of care: Telemetry   Objective: Vitals:   07/23/20 0800 07/23/20 1200  BP: (!) 151/90   Pulse: (!) 59   Resp: 16   Temp: (!) 97.3 F (36.3 C) 97.8 F (36.6 C)   SpO2: 95%     Intake/Output Summary (Last 24 hours) at 07/23/2020 1401 Last data filed at 07/23/2020 1245 Gross per 24 hour  Intake 10 ml  Output 1100 ml  Net -1090 ml    Filed Weights   07/21/20 0753  Weight: 66 kg   Body mass index is 24.21 kg/m.  Exam:  General: Alert and oriented x3, fatigued Cardiovascular: Regular rate and rhythm, S1-S2 Respiratory: Clear to auscultation bilaterally Abdomen: Soft, nontender, nondistended, positive bowel sounds Musculoskeletal: Left upper extremity wrapped Skin: No skin breaks, tears or lesions.  Noted tattoos Psychiatry: Appropriate, no evidence of psychoses Neurology: No focal deficits   Data Reviewed: CBC: Recent Labs  Lab 07/20/20 2330 07/22/20 0259 07/23/20 0244  WBC 27.6* 27.8* 15.2*  NEUTROABS 24.0*  --   --   HGB 16.0 14.7 13.2  HCT 47.3 44.4 39.6  MCV 95.6 95.7 95.7  PLT 311 278 293    Basic Metabolic Panel: Recent Labs  Lab 07/20/20 2330 07/22/20 0259 07/23/20 0244  NA 134* 139 141  K 3.9 4.2 3.7  CL 101 104 103  CO2 23 29 29   GLUCOSE 99 135* 86  BUN 13 15 16   CREATININE 0.94 0.85 0.74  CALCIUM 9.4 8.9 8.9    GFR: Estimated Creatinine Clearance: 113.2 mL/min (by C-G formula based on SCr of 0.74 mg/dL). Liver Function Tests: Recent Labs  Lab 07/20/20 2330  AST 28  ALT 27  ALKPHOS 72  BILITOT 1.2  PROT 8.1  ALBUMIN 3.9  No results for input(s): LIPASE, AMYLASE in the last 168 hours. No results for input(s): AMMONIA in the last 168 hours. Coagulation Profile: No results for input(s): INR, PROTIME in the last 168 hours. Cardiac Enzymes: No results for input(s): CKTOTAL, CKMB, CKMBINDEX, TROPONINI in the last 168 hours. BNP (last 3 results) No results for input(s): PROBNP in the last 8760 hours. HbA1C: No results for input(s): HGBA1C in the last 72 hours. CBG: No results for input(s): GLUCAP in the last 168 hours. Lipid Profile: No results for input(s): CHOL, HDL, LDLCALC, TRIG,  CHOLHDL, LDLDIRECT in the last 72 hours. Thyroid Function Tests: No results for input(s): TSH, T4TOTAL, FREET4, T3FREE, THYROIDAB in the last 72 hours. Anemia Panel: No results for input(s): VITAMINB12, FOLATE, FERRITIN, TIBC, IRON, RETICCTPCT in the last 72 hours. Urine analysis:    Component Value Date/Time   COLORURINE YELLOW 07/21/2020 0220   APPEARANCEUR HAZY (A) 07/21/2020 0220   LABSPEC 1.028 07/21/2020 0220   PHURINE 5.0 07/21/2020 0220   GLUCOSEU NEGATIVE 07/21/2020 0220   HGBUR SMALL (A) 07/21/2020 0220   BILIRUBINUR NEGATIVE 07/21/2020 0220   BILIRUBINUR negative 10/07/2019 1254   KETONESUR 20 (A) 07/21/2020 0220   PROTEINUR 100 (A) 07/21/2020 0220   UROBILINOGEN 0.2 10/07/2019 1254   NITRITE NEGATIVE 07/21/2020 0220   LEUKOCYTESUR NEGATIVE 07/21/2020 0220   Sepsis Labs: @LABRCNTIP (procalcitonin:4,lacticidven:4)  ) Recent Results (from the past 240 hour(s))  Body fluid culture w Gram Stain     Status: None   Collection Time: 07/21/20  3:59 AM   Specimen: Synovium; Body Fluid  Result Value Ref Range Status   Specimen Description   Final    SYNOVIAL LT ELBOW Performed at [facility], [address] [place], [place]    Special Requests   Final    NONE Performed at [facility], [address], [place], [place]    Gram Stain   Final    FEW WBC PRESENT,BOTH PMN AND MONONUCLEAR NO ORGANISMS SEEN    Culture   Final    RARE STREPTOCOCCUS PYOGENES CRITICAL RESULT CALLED TO, READ BACK BY AND VERIFIED WITH: PHARMD T GREEN [id number] AT 1033 BY CM Performed at [facility], [address], [place], [place]    Report Status 07/23/2020 FINAL  Final   Organism ID, Bacteria STREPTOCOCCUS PYOGENES  Final      Susceptibility   Streptococcus pyogenes - MIC*    PENICILLIN <=0.06 SENSITIVE Sensitive     CEFTRIAXONE <=0.12 SENSITIVE Sensitive     ERYTHROMYCIN <=0.12 SENSITIVE Sensitive     LEVOFLOXACIN  0.5 SENSITIVE Sensitive     VANCOMYCIN <=0.12 SENSITIVE Sensitive     * RARE STREPTOCOCCUS PYOGENES  Resp Panel by RT-PCR (Flu A&B, Covid) Nasopharyngeal Swab     Status: None   Collection Time: 07/21/20  5:24 AM   Specimen: Nasopharyngeal Swab; Nasopharyngeal(NP) swabs in vial transport medium  Result Value Ref Range Status   SARS Coronavirus 2 by RT PCR NEGATIVE NEGATIVE Final    Comment: (NOTE) SARS-CoV-2 target nucleic acids are NOT DETECTED.  The SARS-CoV-2 RNA is generally detectable in upper respiratory specimens during the acute phase of infection. The lowest concentration of SARS-CoV-2 viral copies this assay can detect is 138 copies/mL. A negative result does not preclude SARS-Cov-2 infection and should not be used as the sole basis for treatment or other patient management decisions. A negative result may occur with  improper specimen collection/handling, submission of specimen other  than nasopharyngeal swab, presence of viral mutation(s) within the areas targeted by this assay, and inadequate number of viral copies(<138 copies/mL). A negative result must be combined with clinical observations, patient history, and epidemiological information. The expected result is Negative.  Fact Sheet for Patients:  BloggerCourse.com  Fact Sheet for Healthcare Providers:  SeriousBroker.it  This test is no t yet approved or cleared by the [place] FDA and  has been authorized for detection and/or diagnosis of SARS-CoV-2 by FDA under an Emergency Use Authorization (EUA). This EUA will remain  in effect (meaning this test can be used) for the duration of the COVID-19 declaration under Section 564(b)(1) of the Act, 21 U.S.C.section 360bbb-3(b)(1), unless the authorization is terminated  or revoked sooner.       Influenza A by PCR NEGATIVE NEGATIVE Final   Influenza B by PCR NEGATIVE NEGATIVE Final    Comment: (NOTE) The Xpert  Xpress SARS-CoV-2/FLU/RSV plus assay is intended as an aid in the diagnosis of influenza from Nasopharyngeal swab specimens and should not be used as a sole basis for treatment. Nasal washings and aspirates are unacceptable for Xpert Xpress SARS-CoV-2/FLU/RSV testing.  Fact Sheet for Patients: BloggerCourse.com  Fact Sheet for Healthcare Providers: SeriousBroker.it  This test is not yet approved or cleared by the [place] FDA and has been authorized for detection and/or diagnosis of SARS-CoV-2 by FDA under an Emergency Use Authorization (EUA). This EUA will remain in effect (meaning this test can be used) for the duration of the COVID-19 declaration under Section 564(b)(1) of the Act, 21 U.S.C. section 360bbb-3(b)(1), unless the authorization is terminated or revoked.  Performed at [facility], [address], [place], [place] [address]   Aerobic/Anaerobic Culture w Gram Stain (surgical/deep wound)     Status: None (Preliminary result)   Collection Time: 07/21/20 10:37 AM   Specimen: PATH Cytology Misc. fluid; Body Fluid  Result Value Ref Range Status   Specimen Description   Final    ABSCESS LEFT ELBOW Performed at [facility], [address] [place], [place] [address]    Special Requests   Final    NONE Performed at [facility], [address], [place], [place] [address]    Gram Stain MODERATE WBC PRESENT,BOTH PMN AND MONONUCLEAR  Final   Culture   Final    NO GROWTH 2 DAYS NO ANAEROBES ISOLATED; CULTURE IN PROGRESS FOR 5 DAYS Performed at [facility], [address] [place], [place] [address]    Report Status PENDING  Incomplete  MRSA Next Gen by PCR, Nasal     Status: None   Collection Time: 07/21/20  5:50 PM   Specimen: Nasal Mucosa; Nasal Swab  Result Value Ref Range Status   MRSA by PCR Next Gen NOT DETECTED NOT DETECTED Final    Comment:  (NOTE) The GeneXpert MRSA Assay (FDA approved for NASAL specimens only), is one component of a comprehensive MRSA colonization surveillance program. It is not intended to diagnose MRSA infection nor to guide or monitor treatment for MRSA infections. Test performance is not FDA approved in patients less than 55 years old. Performed at [facility], [address] [place], [place] [address]       Studies: No results found.  Scheduled Meds:  Chlorhexidine Gluconate Cloth  6 each Topical Daily   docusate sodium  100 mg Oral BID   lidocaine  1 patch Transdermal Q24H   pantoprazole  40 mg Oral Daily  sodium chloride flush  10-40 mL Intracatheter Q12H    Continuous Infusions:  methocarbamol (ROBAXIN) IV     penicillin g continuous IV infusion 12 Million Units (07/23/20 [id number])     LOS: 2 days     Hollice EspySendil K Alverna Fawley, MD [facility]   07/23/2020, 2:01 PM

## 2020-07-23 NOTE — Progress Notes (Signed)
    Subjective: 2 Days Post-Op Procedure(s) (LRB): INCISION AND DRAINAGE ABSCESS (Left) Patient reports pain as 1 on 0-10 scale.   Denies CP or SOB.  Voiding without difficulty. Positive flatus. Objective: Vital signs in last 24 hours: Temp:  [97.6 F (36.4 C)-98.1 F (36.7 C)] 97.9 F (36.6 C) (07/21 0340) Pulse Rate:  [52-79] 52 (07/21 0700) Resp:  [5-25] 15 (07/21 0700) BP: (126-163)/(60-90) 148/90 (07/21 0400) SpO2:  [88 %-100 %] 95 % (07/21 0700)  Intake/Output from previous day: 07/20 0701 - 07/21 0700 In: 610.8 [I.V.:310.8; IV Piggyback:300] Out: 1050 [Urine:1050] Intake/Output this shift: No intake/output data recorded.  Labs: Recent Labs    07/20/20 2330 07/22/20 0259 07/23/20 0244  HGB 16.0 14.7 13.2   Recent Labs    07/22/20 0259 07/23/20 0244  WBC 27.8* 15.2*  RBC 4.64 4.14*  HCT 44.4 39.6  PLT 278 293   Recent Labs    07/22/20 0259 07/23/20 0244  NA 139 141  K 4.2 3.7  CL 104 103  CO2 29 29  BUN 15 16  CREATININE 0.85 0.74  GLUCOSE 135* 86  CALCIUM 8.9 8.9   No results for input(s): LABPT, INR in the last 72 hours.  Physical Exam: Neurologically intact Intact pulses distally No cellulitis present Compartment soft Splint is in good condition.  Moving all fingers with no significant pain.  No swelling noted.  No pain with passive stretch of the digits.  No other complaints extremity/joint pain noted. Body mass index is 24.21 kg/m.   Assessment/Plan: 2 Days Post-Op Procedure(s) (LRB): INCISION AND DRAINAGE ABSCESS (Left) Patient is stable from orthopedic standpoint. PICC line has been placed for IV antibiotic therapy. Pain is well controlled with oral medications. From orthopedic standpoint patient can be discharged to home when medically cleared.  He will follow-up with me on 07/28/2020 for removal of the splint. No further recommendations noted at this time.  Alvy Beal for Dr. Venita Lick [facility] [phone] 07/23/2020, 8:02 AM

## 2020-07-23 NOTE — Op Note (Signed)
Addendum: Debridement type: Excisional Debridement  Side: left  Body Location: elbow   Tools used for debridement: scalpel  Debridement depth beyond dead/damaged tissue down to healthy viable tissue: yes  Tissue layer involved: Other: joint  Nature of tissue removed: Purulence  Irrigation volume: 3L     Irrigation fluid type: Normal Saline

## 2020-07-23 NOTE — TOC Progression Note (Addendum)
Transition of Care [facility]) - Progression Note    Patient Details  Name: AMIAS HUTCHINSON MRN: [id number] Date of Birth: 1985/10/05  [facility] CM/SW Contact  Golda Acre, RN Phone Number: 07/23/2020, 7:57 AM  Clinical Narrative:    Patient had picc line inserted on 072022, Jeri Modena with [employer] notified of possible home IV abx need. [facility].  Expected Discharge Plan: Home w Home Health Services Barriers to Discharge: Continued Medical Work up  Expected Discharge Plan and Services Expected Discharge Plan: Home w Home Health Services   Discharge Planning Services: CM Consult   Living arrangements for the past 2 months: Single Family Home                                       Social Determinants of Health (SDOH) Interventions    Readmission Risk Interventions No flowsheet data found.

## 2020-07-24 ENCOUNTER — Inpatient Hospital Stay (HOSPITAL_COMMUNITY): Payer: Self-pay

## 2020-07-24 DIAGNOSIS — R079 Chest pain, unspecified: Secondary | ICD-10-CM

## 2020-07-24 DIAGNOSIS — I10 Essential (primary) hypertension: Secondary | ICD-10-CM

## 2020-07-24 LAB — GLUCOSE, CAPILLARY: Glucose-Capillary: 102 mg/dL — ABNORMAL HIGH (ref 70–99)

## 2020-07-24 LAB — CBC
HCT: 38.1 % — ABNORMAL LOW (ref 39.0–52.0)
Hemoglobin: 12.9 g/dL — ABNORMAL LOW (ref 13.0–17.0)
MCH: 32.3 pg (ref 26.0–34.0)
MCHC: 33.9 g/dL (ref 30.0–36.0)
MCV: 95.3 fL (ref 80.0–100.0)
Platelets: 279 10*3/uL (ref 150–400)
RBC: 4 MIL/uL — ABNORMAL LOW (ref 4.22–5.81)
RDW: 12.6 % (ref 11.5–15.5)
WBC: 11.1 10*3/uL — ABNORMAL HIGH (ref 4.0–10.5)
nRBC: 0 % (ref 0.0–0.2)

## 2020-07-24 MED ORDER — LISINOPRIL 5 MG PO TABS
5.0000 mg | ORAL_TABLET | Freq: Every day | ORAL | Status: DC
  Administered 2020-07-24 – 2020-07-25 (×2): 5 mg via ORAL
  Filled 2020-07-24 (×2): qty 2

## 2020-07-24 MED ORDER — FUROSEMIDE 10 MG/ML IJ SOLN
20.0000 mg | Freq: Once | INTRAMUSCULAR | Status: AC
  Administered 2020-07-24: 20 mg via INTRAVENOUS
  Filled 2020-07-24: qty 2

## 2020-07-24 NOTE — Progress Notes (Signed)
Subjective: 3 Days Post-Op Procedure(s) (LRB): INCISION AND DRAINAGE ABSCESS (Left) Patient reports pain as mild.   No significant elbow pain. +void, +flatus Denies CP, SOB, calf pain.   Objective: Vital signs in last 24 hours: Temp:  [97.5 F (36.4 C)-98.4 F (36.9 C)] 97.6 F (36.4 C) (07/22 0700) Pulse Rate:  [59-86] 59 (07/22 0500) Resp:  [11-23] 16 (07/22 0500) BP: (152-183)/(68-90) 152/68 (07/22 0400) SpO2:  [96 %-99 %] 96 % (07/22 0500)  Intake/Output from previous day: 07/21 0701 - 07/22 0700 In: 864.6 [IV Piggyback:864.6] Out: 3050 [Urine:3050] Intake/Output this shift: No intake/output data recorded.  Recent Labs    07/22/20 0259 07/23/20 0244 07/24/20 0255  HGB 14.7 13.2 12.9*   Recent Labs    07/23/20 0244 07/24/20 0255  WBC 15.2* 11.1*  RBC 4.14* 4.00*  HCT 39.6 38.1*  PLT 293 279   Recent Labs    07/22/20 0259 07/23/20 0244  NA 139 141  K 4.2 3.7  CL 104 103  CO2 29 29  BUN 15 16  CREATININE 0.85 0.74  GLUCOSE 135* 86  CALCIUM 8.9 8.9   No results for input(s): LABPT, INR in the last 72 hours.  AAOx3. Patient in splint Able to move his fingers. Sensation of fingers in tact. Good grip strength. Cap refill <2sec No complaints of new joint pain.    Assessment/Plan: 3 Days Post-Op Procedure(s) (LRB): INCISION AND DRAINAGE ABSCESS (Left)  Patient remains stable from orthopedic standpoint. White count improving. No fevers.  PICC line has been placed for IV antibiotic therapy. Pain is well controlled with oral medications. Barrier to discharge: at this time patient does not have a safe place to go where he can refrigerate abx. Appreciate assistance from social work, ID and hospitalist.   He will follow-up with Dr. Shon Baton on 07/28/2020 for removal of the splint. No further recommendations noted at this time.   Rhodia Albright 07/24/2020, 8:13 AM

## 2020-07-24 NOTE — Progress Notes (Addendum)
PROGRESS NOTE  Zerita BoersKelvin D Dossett WUJ:[id number]:[id number] DOB: 1985/10/31 DOA: 07/20/2020 PCP: Patient, No Pcp Per (Inactive)  HPI/Recap of past 4424 hours: 35 year old male without any major past medical history presented to the emergency room on the morning of 7/19 with 36-hour history of left elbow pain and swelling.  Ultrasound noted purulent material.  Admitted to the hospital service for septic arthritis of left elbow joint.  Orthopedic surgery took patient to the OR later that morning for I&D with culture sent.  Patient initially placed in stepdown unit.  Cultures came back for group A strep and patient changed to IV penicillin, with infectious disease recommending 2 weeks of IV therapy.  PICC line placed.  It was discovered that patient broke up with his fiance and now does not have a safe place to go for discharge.  This morning, white blood cell count down to 11.1.  Since admission, patient's blood pressure has been elevated.  No previous history of hypertension.  Patient continues to complain of some discomfort on his right side of his chest, especially with deep inspiration.  He says it limits him from taking a full inspiratory effort in with the incentive spirometer.  Chest x-ray notes small right pleural effusion and no evidence of pneumothorax.   Assessment/Plan: Principal Problem:   Septic joint of left elbow [facility](HCC): Cultures for group A strep.  2 weeks of IV penicillin followed by oral therapy.  Cleared by orthopedic surgery.  New issue has arisen and that patient has nowhere to go.  Apparently was staying with a friend and that is not an option any longer.  Unable to go to a homeless shelter due to refrigeration needed of IV antibiotics.  White blood cell count near resolved.  HTN: No previous history.  Will give a dose of Lasix given aggressive fluid resuscitation and started HCTZ.  Right-sided chest pain: Noted right pleural effusion.  Lasix and HCTZ should hopefully take care of  this.   Code Status: Full code  Family Communication: Fianc on phone on first day.  Now they are not talking.    Disposition Plan: Location for safe discharge appears to be barrier.  Needs IV antibiotics until August 1.   Consultants: Orthopedic surgery Infectious disease  Procedures: Status post I&D of left elbow abscess 7/19 Placement of PICC line 7/20  Antimicrobials: IV Rocephin and vancomycin 7/19-7/20 IV penicillin 7/20-until 8/1  DVT prophylaxis: SCDs  Level of care: Med-Surg   Objective: Vitals:   07/24/20 0500 07/24/20 0700  BP:    Pulse: (!) 59   Resp: 16   Temp:  97.6 F (36.4 C)  SpO2: 96%     Intake/Output Summary (Last 24 hours) at 07/24/2020 0928 Last data filed at 07/24/2020 0331 Gross per 24 hour  Intake 864.58 ml  Output 3050 ml  Net -2185.42 ml    Filed Weights   07/21/20 0753  Weight: 66 kg   Body mass index is 24.21 kg/m.  Exam:  General: Alert and oriented x3, fatigued Cardiovascular: Regular rate and rhythm, S1-S2 Respiratory: Clear to auscultation bilaterally Abdomen: Soft, nontender, nondistended, positive bowel sounds Musculoskeletal: Left upper extremity wrapped Skin: No skin breaks, tears or lesions.  Noted tattoos Psychiatry: Appropriate, no evidence of psychoses Neurology: No focal deficits   Data Reviewed: CBC: Recent Labs  Lab 07/20/20 2330 07/22/20 0259 07/23/20 0244 07/24/20 0255  WBC 27.6* 27.8* 15.2* 11.1*  NEUTROABS 24.0*  --   --   --   HGB 16.0 14.7 13.2 12.9*  HCT 47.3 44.4 39.6 38.1*  MCV 95.6 95.7 95.7 95.3  PLT 311 278 293 279    Basic Metabolic Panel: Recent Labs  Lab 07/20/20 2330 07/22/20 0259 07/23/20 0244  NA 134* 139 141  K 3.9 4.2 3.7  CL 101 104 103  CO2 23 29 29   GLUCOSE 99 135* 86  BUN 13 15 16   CREATININE 0.94 0.85 0.74  CALCIUM 9.4 8.9 8.9    GFR: Estimated Creatinine Clearance: 113.2 mL/min (by C-G formula based on SCr of 0.74 mg/dL). Liver Function Tests: Recent  Labs  Lab 07/20/20 2330  AST 28  ALT 27  ALKPHOS 72  BILITOT 1.2  PROT 8.1  ALBUMIN 3.9    No results for input(s): LIPASE, AMYLASE in the last 168 hours. No results for input(s): AMMONIA in the last 168 hours. Coagulation Profile: No results for input(s): INR, PROTIME in the last 168 hours. Cardiac Enzymes: No results for input(s): CKTOTAL, CKMB, CKMBINDEX, TROPONINI in the last 168 hours. BNP (last 3 results) No results for input(s): PROBNP in the last 8760 hours. HbA1C: No results for input(s): HGBA1C in the last 72 hours. CBG: No results for input(s): GLUCAP in the last 168 hours. Lipid Profile: No results for input(s): CHOL, HDL, LDLCALC, TRIG, CHOLHDL, LDLDIRECT in the last 72 hours. Thyroid Function Tests: No results for input(s): TSH, T4TOTAL, FREET4, T3FREE, THYROIDAB in the last 72 hours. Anemia Panel: No results for input(s): VITAMINB12, FOLATE, FERRITIN, TIBC, IRON, RETICCTPCT in the last 72 hours. Urine analysis:    Component Value Date/Time   COLORURINE YELLOW 07/21/2020 0220   APPEARANCEUR HAZY (A) 07/21/2020 0220   LABSPEC 1.028 07/21/2020 0220   PHURINE 5.0 07/21/2020 0220   GLUCOSEU NEGATIVE 07/21/2020 0220   HGBUR SMALL (A) 07/21/2020 0220   BILIRUBINUR NEGATIVE 07/21/2020 0220   BILIRUBINUR negative 10/07/2019 1254   KETONESUR 20 (A) 07/21/2020 0220   PROTEINUR 100 (A) 07/21/2020 0220   UROBILINOGEN 0.2 10/07/2019 1254   NITRITE NEGATIVE 07/21/2020 0220   LEUKOCYTESUR NEGATIVE 07/21/2020 0220   Sepsis Labs: @LABRCNTIP (procalcitonin:4,lacticidven:4)  ) Recent Results (from the past 240 hour(s))  Body fluid culture w Gram Stain     Status: None   Collection Time: 07/21/20  3:59 AM   Specimen: Synovium; Body Fluid  Result Value Ref Range Status   Specimen Description   Final    SYNOVIAL LT ELBOW Performed at [facility], [address], [place], [place]    Special Requests   Final    NONE Performed at [facility], [address] [place], [place]    Gram Stain   Final    FEW WBC PRESENT,BOTH PMN AND MONONUCLEAR NO ORGANISMS SEEN    Culture   Final    RARE STREPTOCOCCUS PYOGENES CRITICAL RESULT CALLED TO, READ BACK BY AND VERIFIED WITH: PHARMD T GREEN [place] AT 1033 BY CM Performed at [facility], [address], [place], [facility] [address]    Report Status 07/23/2020 FINAL  Final   Organism ID, Bacteria STREPTOCOCCUS PYOGENES  Final      Susceptibility   Streptococcus pyogenes - MIC*    PENICILLIN <=0.06 SENSITIVE Sensitive     CEFTRIAXONE <=0.12 SENSITIVE Sensitive     ERYTHROMYCIN <=0.12 SENSITIVE Sensitive     LEVOFLOXACIN 0.5 SENSITIVE Sensitive     VANCOMYCIN <=0.12 SENSITIVE Sensitive     * RARE STREPTOCOCCUS PYOGENES  Resp Panel by RT-PCR (Flu A&B, Covid) Nasopharyngeal Swab  Status: None   Collection Time: 07/21/20  5:24 AM   Specimen: Nasopharyngeal Swab; Nasopharyngeal(NP) swabs in vial transport medium  Result Value Ref Range Status   SARS Coronavirus 2 by RT PCR NEGATIVE NEGATIVE Final    Comment: (NOTE) SARS-CoV-2 target nucleic acids are NOT DETECTED.  The SARS-CoV-2 RNA is generally detectable in upper respiratory specimens during the acute phase of infection. The lowest concentration of SARS-CoV-2 viral copies this assay can detect is 138 copies/mL. A negative result does not preclude SARS-Cov-2 infection and should not be used as the sole basis for treatment or other patient management decisions. A negative result may occur with  improper specimen collection/handling, submission of specimen other than nasopharyngeal swab, presence of viral mutation(s) within the areas targeted by this assay, and inadequate number of viral copies(<138 copies/mL). A negative result must be combined with clinical observations, patient history, and epidemiological information. The expected result is Negative.  Fact Sheet for Patients:   BloggerCourse.com  Fact Sheet for Healthcare Providers:  SeriousBroker.it  This test is no t yet approved or cleared by the [place] FDA and  has been authorized for detection and/or diagnosis of SARS-CoV-2 by FDA under an Emergency Use Authorization (EUA). This EUA will remain  in effect (meaning this test can be used) for the duration of the COVID-19 declaration under Section 564(b)(1) of the Act, 21 U.S.C.section 360bbb-3(b)(1), unless the authorization is terminated  or revoked sooner.       Influenza A by PCR NEGATIVE NEGATIVE Final   Influenza B by PCR NEGATIVE NEGATIVE Final    Comment: (NOTE) The Xpert Xpress SARS-CoV-2/FLU/RSV plus assay is intended as an aid in the diagnosis of influenza from Nasopharyngeal swab specimens and should not be used as a sole basis for treatment. Nasal washings and aspirates are unacceptable for Xpert Xpress SARS-CoV-2/FLU/RSV testing.  Fact Sheet for Patients: BloggerCourse.com  Fact Sheet for Healthcare Providers: SeriousBroker.it  This test is not yet approved or cleared by the [place] FDA and has been authorized for detection and/or diagnosis of SARS-CoV-2 by FDA under an Emergency Use Authorization (EUA). This EUA will remain in effect (meaning this test can be used) for the duration of the COVID-19 declaration under Section 564(b)(1) of the Act, 21 U.S.C. section 360bbb-3(b)(1), unless the authorization is terminated or revoked.  Performed at [facility], [address], [place], [place] [address]   Aerobic/Anaerobic Culture w Gram Stain (surgical/deep wound)     Status: None (Preliminary result)   Collection Time: 07/21/20 10:37 AM   Specimen: PATH Cytology Misc. fluid; Body Fluid  Result Value Ref Range Status   Specimen Description   Final    ABSCESS LEFT ELBOW Performed at [facility], [address] [place], [place] [address]    Special Requests   Final    NONE Performed at [facility], [address], [place], [place] [address]    Gram Stain MODERATE WBC PRESENT,BOTH PMN AND MONONUCLEAR  Final   Culture   Final    NO GROWTH 2 DAYS NO ANAEROBES ISOLATED; CULTURE IN PROGRESS FOR 5 DAYS Performed at [facility], [address] [place], [place] [address]    Report Status PENDING  Incomplete  MRSA Next Gen by PCR, Nasal     Status: None   Collection Time: 07/21/20  5:50 PM   Specimen: Nasal Mucosa; Nasal Swab  Result Value Ref Range Status   MRSA by PCR Next Gen NOT  DETECTED NOT DETECTED Final    Comment: (NOTE) The GeneXpert MRSA Assay (FDA approved for NASAL specimens only), is one component of a comprehensive MRSA colonization surveillance program. It is not intended to diagnose MRSA infection nor to guide or monitor treatment for MRSA infections. Test performance is not FDA approved in patients less than 30 years old. Performed at [facility], [address], [place], [place] [address]       Studies: No results found.  Scheduled Meds:  Chlorhexidine Gluconate Cloth  6 each Topical Daily   docusate sodium  100 mg Oral BID   furosemide  20 mg Intravenous Once   lidocaine  1 patch Transdermal Q24H   lisinopril  5 mg Oral Daily   pantoprazole  40 mg Oral Daily   sodium chloride flush  10-40 mL Intracatheter Q12H    Continuous Infusions:  methocarbamol (ROBAXIN) IV     penicillin g continuous IV infusion 12 Million Units (07/23/20 2137)     LOS: 3 days     Hollice Espy, MD [facility]   07/24/2020, 9:28 AM

## 2020-07-25 LAB — BASIC METABOLIC PANEL
Anion gap: 6 (ref 5–15)
BUN: 12 mg/dL (ref 6–20)
CO2: 32 mmol/L (ref 22–32)
Calcium: 8.9 mg/dL (ref 8.9–10.3)
Chloride: 101 mmol/L (ref 98–111)
Creatinine, Ser: 0.74 mg/dL (ref 0.61–1.24)
GFR, Estimated: >60 mL/min (ref >60)
Glucose, Bld: 87 mg/dL (ref 70–99)
Potassium: 3.7 mmol/L (ref 3.5–5.1)
Sodium: 139 mmol/L (ref 135–145)

## 2020-07-25 MED ORDER — DICLOFENAC SODIUM 1 % EX GEL
2.0000 g | Freq: Four times a day (QID) | CUTANEOUS | Status: DC
  Administered 2020-07-25 – 2020-07-26 (×5): 2 g via TOPICAL
  Filled 2020-07-25: qty 100

## 2020-07-25 NOTE — Progress Notes (Signed)
PROGRESS NOTE    [name]   GEX:[id number]  DOB: October 09, 1985  PCP: Patient, No Pcp Per (Inactive)    DOA: 07/20/2020 LOS: 4   Assessment & Plan   Principal Problem:   Septic joint of left elbow (HCC) Active Problems:   Septic arthritis (HCC)   Septic arthritis of elbow, left (HCC)   Essential hypertension   Septic joint of left elbow - Culture grew group A strep.   --2 weeks of IV penicillin followed by oral therapy.   --D/w ID today, okay with Rocephin 2 g q24h if pt able to d/c --Cleared by orthopedic surgery.   --Discharge barrier has been pt newly separated from fiance and unable to return home, now homeless.  He reports likely able to stay w a friend for a couple of days, and boss has offered to put up in hotel.   --[facility] following for [facility] infusion service for IV antibiotics --D/C location must have refrigeration for storage of Abx    Hypertension - likely due to severe major life stressors ongoing.  No previous history of HTN.  Treated with Lasix given aggressive fluid resuscitation and started HCTZ. 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  No rib fractures seen on xray.  No relief with lidocaine.  Trial of Voltaren gel.  Continue oral pain med PRN.  Has trace right pleural effusion on xray. Lasix and HCTZ should hopefully take care of this.  No SOB or fevers / chills.    Patient BMI: Body mass index is 24.21 kg/m.   DVT prophylaxis: SCDs Start: 07/21/20 1438 SCDs Start: 07/21/20 0740   Diet:  Diet Orders (From admission, onward)     Start     Ordered   07/21/20 1626  Diet regular Room service appropriate? Yes; Fluid consistency: Thin  Diet effective now       Question Answer Comment  Room service appropriate? Yes   Fluid consistency: Thin      07/21/20 1626              Code  Status: Full Code   Brief Narrative / Hospital Course to Date:   "35 year old male without any major past medical history presented to the emergency room on the morning of 7/19 with 36-hour history of left elbow pain and swelling.  Ultrasound noted purulent material.  Admitted to the hospital service for septic arthritis of left elbow joint.  Orthopedic surgery took patient to the OR later that morning for I&D with culture sent.  Patient initially placed in stepdown unit.  Cultures came back for group A strep and patient changed to IV penicillin, with infectious disease recommending 2 weeks of IV therapy.  PICC line placed.  It was discovered that patient broke up with his fiance and now does not have a safe place to go for discharge.   This morning, white blood cell count down to 11.1.  Since admission, patient's blood pressure has been elevated.  No previous history of hypertension.  Patient continues to complain of some discomfort on his right side of his chest, especially with deep inspiration.  He says it limits him from taking a full inspiratory effort in with the incentive spirometer.  Chest x-ray notes small right pleural effusion and no evidence of pneumothorax."  Subjective 07/25/20    Pt up  in chair sitting by window.  Reports he really needs to be out of hospital to deal with personal and work issues.  Thinks he may have a good d/c plan in place - stay w a friend and then at a hotel.  Continues to have right-sided rib cage pain worse on deep inspiration.  No F/C, SOB, N/V or other complaints.  Has pain in elbow but states it's mostly tolerable.  Oral pain meds help both.  Heat help rib cage a lot.   Disposition Plan & Communication   Status is: Inpatient  Remains inpatient appropriate because:IV treatments appropriate due to intensity of illness or inability to take PO.  Unsafe d/c plan for getting IV antibiotics.    Dispo: The patient is from: Home              Anticipated d/c is  to:  TBD               Patient currently is medically stable to d/c.   Difficult to place patient No     Consults, Procedures, Significant Events   Consultants:  Orthopedic surgery Infectious disease  Procedures:  Status post I&D of left elbow abscess 7/19 Placement of PICC line 7/20  Antimicrobials:  Anti-infectives (From admission, onward)    Start     Dose/Rate Route Frequency Ordered Stop   07/22/20 1900  penicillin G potassium 12 Million Units in dextrose 5 % 500 mL continuous infusion        12 Million Units 41.7 mL/hr over 12 Hours Intravenous Every 12 hours 07/22/20 1110     07/22/20 0600  cefTRIAXone (ROCEPHIN) 2 g in sodium chloride 0.9 % 100 mL IVPB  Status:  Discontinued        2 g 200 mL/hr over 30 Minutes Intravenous Every 24 hours 07/21/20 0741 07/22/20 1110   07/21/20 1800  vancomycin (VANCOCIN) IVPB 1000 mg/200 mL premix  Status:  Discontinued        1,000 mg 200 mL/hr over 60 Minutes Intravenous Every 12 hours 07/21/20 0808 07/22/20 1110   07/21/20 0415  vancomycin (VANCOREADY) IVPB 1250 mg/250 mL        1,250 mg 166.7 mL/hr over 90 Minutes Intravenous STAT 07/21/20 0401 07/21/20 0650   07/21/20 0400  cefTRIAXone (ROCEPHIN) 2 g in sodium chloride 0.9 % 100 mL IVPB        2 g 200 mL/hr over 30 Minutes Intravenous  Once 07/21/20 0358 07/21/20 0513         Micro    Objective   Vitals:   07/25/20 0826 07/25/20 0953 07/25/20 1149 07/25/20 1227  BP:  (!) 191/113 (!) 154/92   Pulse:      Resp:  (!) 25 11   Temp: 98 F (36.7 C)   98.2 F (36.8 C)  TempSrc: Oral   Oral  SpO2:      Weight:      Height:        Intake/Output Summary (Last 24 hours) at 07/25/2020 1403 Last data filed at 07/25/2020 1234 Gross per 24 hour  Intake --  Output 1275 ml  Net -1275 ml   Filed Weights   07/21/20 0753  Weight: 66 kg    Physical Exam:  General exam: awake, alert, no acute distress Respiratory system: CTAB, normal respiratory effort, on room  air. Cardiovascular system: RRR, no pedal edema.   MSK: mild swelling with tenderness on palpation of right lateral rib cage Central nervous system: A&O x4. no gross  focal neurologic deficits, normal speech Extremities: left upper extremity in ACE wrap with splint, no deformities seen, no edema Psychiatry: normal mood, congruent affect, judgement and insight appear normal  Labs   Data Reviewed: I have personally reviewed following labs and imaging studies  CBC: Recent Labs  Lab 07/20/20 2330 07/22/20 0259 07/23/20 0244 07/24/20 0255  WBC 27.6* 27.8* 15.2* 11.1*  NEUTROABS 24.0*  --   --   --   HGB 16.0 14.7 13.2 12.9*  HCT 47.3 44.4 39.6 38.1*  MCV 95.6 95.7 95.7 95.3  PLT 311 278 293 279   Basic Metabolic Panel: Recent Labs  Lab 07/20/20 2330 07/22/20 0259 07/23/20 0244 07/25/20 0157  NA 134* 139 141 139  K 3.9 4.2 3.7 3.7  CL 101 104 103 101  CO2 23 29 29  32  GLUCOSE 99 135* 86 87  BUN 13 15 16 12   CREATININE 0.94 0.85 0.74 0.74  CALCIUM 9.4 8.9 8.9 8.9   GFR: Estimated Creatinine Clearance: 113.2 mL/min (by C-G formula based on SCr of 0.74 mg/dL). Liver Function Tests: Recent Labs  Lab 07/20/20 2330  AST 28  ALT 27  ALKPHOS 72  BILITOT 1.2  PROT 8.1  ALBUMIN 3.9   No results for input(s): LIPASE, AMYLASE in the last 168 hours. No results for input(s): AMMONIA in the last 168 hours. Coagulation Profile: No results for input(s): INR, PROTIME in the last 168 hours. Cardiac Enzymes: No results for input(s): CKTOTAL, CKMB, CKMBINDEX, TROPONINI in the last 168 hours. BNP (last 3 results) No results for input(s): PROBNP in the last 8760 hours. HbA1C: No results for input(s): HGBA1C in the last 72 hours. CBG: Recent Labs  Lab 07/24/20 2321  GLUCAP 102*   Lipid Profile: No results for input(s): CHOL, HDL, LDLCALC, TRIG, CHOLHDL, LDLDIRECT in the last 72 hours. Thyroid Function Tests: No results for input(s): TSH, T4TOTAL, FREET4, T3FREE, THYROIDAB in  the last 72 hours. Anemia Panel: No results for input(s): VITAMINB12, FOLATE, FERRITIN, TIBC, IRON, RETICCTPCT in the last 72 hours. Sepsis Labs: Recent Labs  Lab 07/20/20 2330 07/21/20 1725 07/21/20 2002  LATICACIDVEN 0.8 1.2 1.0    Recent Results (from the past 240 hour(s))  Body fluid culture w Gram Stain     Status: None   Collection Time: 07/21/20  3:59 AM   Specimen: Synovium; Body Fluid  Result Value Ref Range Status   Specimen Description   Final    SYNOVIAL LT ELBOW Performed at [facility], [address], [place], [place]    Special Requests   Final    NONE Performed at [facility], [address], [place], [place]    Gram Stain   Final    FEW WBC PRESENT,BOTH PMN AND MONONUCLEAR NO ORGANISMS SEEN    Culture   Final    RARE STREPTOCOCCUS PYOGENES CRITICAL RESULT CALLED TO, READ BACK BY AND VERIFIED WITH: PHARMD T GREEN [place] AT 1033 BY CM Performed at [facility], [address], [place], [address] [place]    Report Status 07/23/2020 FINAL  Final   Organism ID, Bacteria STREPTOCOCCUS PYOGENES  Final      Susceptibility   Streptococcus pyogenes - MIC*    PENICILLIN <=0.06 SENSITIVE Sensitive     CEFTRIAXONE <=0.12 SENSITIVE Sensitive     ERYTHROMYCIN <=0.12 SENSITIVE Sensitive     LEVOFLOXACIN 0.5 SENSITIVE Sensitive     VANCOMYCIN <=0.12 SENSITIVE Sensitive     * RARE STREPTOCOCCUS  PYOGENES  Resp Panel by RT-PCR (Flu A&B, Covid) Nasopharyngeal Swab     Status: None   Collection Time: 07/21/20  5:24 AM   Specimen: Nasopharyngeal Swab; Nasopharyngeal(NP) swabs in vial transport medium  Result Value Ref Range Status   SARS Coronavirus 2 by RT PCR NEGATIVE NEGATIVE Final    Comment: (NOTE) SARS-CoV-2 target nucleic acids are NOT DETECTED.  The SARS-CoV-2 RNA is generally detectable in upper respiratory specimens during the acute phase of infection. The lowest concentration of SARS-CoV-2  viral copies this assay can detect is 138 copies/mL. A negative result does not preclude SARS-Cov-2 infection and should not be used as the sole basis for treatment or other patient management decisions. A negative result may occur with  improper specimen collection/handling, submission of specimen other than nasopharyngeal swab, presence of viral mutation(s) within the areas targeted by this assay, and inadequate number of viral copies(<138 copies/mL). A negative result must be combined with clinical observations, patient history, and epidemiological information. The expected result is Negative.  Fact Sheet for Patients:  BloggerCourse.comhttps://www.fda.gov/media/152166/download  Fact Sheet for Healthcare Providers:  SeriousBroker.ithttps://www.fda.gov/media/152162/download  This test is no t yet approved or cleared by the [place] FDA and  has been authorized for detection and/or diagnosis of SARS-CoV-2 by FDA under an Emergency Use Authorization (EUA). This EUA will remain  in effect (meaning this test can be used) for the duration of the COVID-19 declaration under Section 564(b)(1) of the Act, 21 U.S.C.section 360bbb-3(b)(1), unless the authorization is terminated  or revoked sooner.       Influenza A by PCR NEGATIVE NEGATIVE Final   Influenza B by PCR NEGATIVE NEGATIVE Final    Comment: (NOTE) The Xpert Xpress SARS-CoV-2/FLU/RSV plus assay is intended as an aid in the diagnosis of influenza from Nasopharyngeal swab specimens and should not be used as a sole basis for treatment. Nasal washings and aspirates are unacceptable for Xpert Xpress SARS-CoV-2/FLU/RSV testing.  Fact Sheet for Patients: BloggerCourse.comhttps://www.fda.gov/media/152166/download  Fact Sheet for Healthcare Providers: SeriousBroker.ithttps://www.fda.gov/media/152162/download  This test is not yet approved or cleared by the [place] FDA and has been authorized for detection and/or diagnosis of SARS-CoV-2 by FDA under an Emergency Use Authorization  (EUA). This EUA will remain in effect (meaning this test can be used) for the duration of the COVID-19 declaration under Section 564(b)(1) of the Act, 21 U.S.C. section 360bbb-3(b)(1), unless the authorization is terminated or revoked.  Performed at [facility], [address] [place], [place] [address]   Aerobic/Anaerobic Culture w Gram Stain (surgical/deep wound)     Status: None (Preliminary result)   Collection Time: 07/21/20 10:37 AM   Specimen: PATH Cytology Misc. fluid; Body Fluid  Result Value Ref Range Status   Specimen Description   Final    ABSCESS LEFT ELBOW Performed at [facility], [address] [place], [place] [address]    Special Requests   Final    NONE Performed at [facility], [address] [place], [place] [address]    Gram Stain MODERATE WBC PRESENT,BOTH PMN AND MONONUCLEAR  Final   Culture   Final    NO GROWTH 4 DAYS NO ANAEROBES ISOLATED; CULTURE IN PROGRESS FOR 5 DAYS Performed at [facility], [address] [place], [place] [address]    Report Status PENDING  Incomplete  MRSA Next Gen by PCR, Nasal     Status: None   Collection Time: 07/21/20  5:50 PM   Specimen: Nasal Mucosa; Nasal  Swab  Result Value Ref Range Status   MRSA by PCR Next Gen NOT DETECTED NOT DETECTED Final    Comment: (NOTE) The GeneXpert MRSA Assay (FDA approved for NASAL specimens only), is one component of a comprehensive MRSA colonization surveillance program. It is not intended to diagnose MRSA infection nor to guide or monitor treatment for MRSA infections. Test performance is not FDA approved in patients less than 52 years old. Performed at [facility], [address], [place], [place] [address]       Imaging Studies   DG Chest 2 View  Result Date: 07/24/2020 CLINICAL DATA:  Right-sided chest pain. EXAM: CHEST - 2 VIEW COMPARISON:  Radiograph 07/20/2020 FINDINGS: Right upper  extremity PICC tip is in the lower SVC. There is no pneumothorax. Trace blunting of the right costophrenic angle with small effusion. This is new from prior exam. No focal airspace disease. The heart is normal in size with normal mediastinal contours. No pulmonary edema. No acute osseous abnormalities are seen IMPRESSION: 1. Trace right pleural effusion, new from prior. 2. Right upper extremity PICC with tip in the lower SVC. No pneumothorax. Electronically Signed   By: Narda Rutherford M.D.   On: 07/24/2020 14:28     Medications   Scheduled Meds:  Chlorhexidine Gluconate Cloth  6 each Topical Daily   diclofenac Sodium  2 g Topical QID   docusate sodium  100 mg Oral BID   lidocaine  1 patch Transdermal Q24H   lisinopril  5 mg Oral Daily   pantoprazole  40 mg Oral Daily   sodium chloride flush  10-40 mL Intracatheter Q12H   Continuous Infusions:  methocarbamol (ROBAXIN) IV     penicillin g continuous IV infusion 12 Million Units (07/24/20 2156)       LOS: 4 days    Time spent: 30 minutes    Pennie Banter, DO [facility]  07/25/2020, 2:03 PM      If 7PM-7AM, please contact night-coverage. How to contact the [facility] Attending or Consulting provider 7A - 7P or covering provider during after hours 7P -7A, for this patient?    Check the care team in [facility] and look for a) attending/consulting TRH provider listed and b) the [facility] team listed Log into [email or web address] and use [facility]'s universal password to access. If you do not have the password, please contact the hospital operator. Locate the [facility] provider you are looking for under [facility] and page to a number that you can be directly reached. If you still have difficulty reaching the provider, please page the [facility] (Director on Call) for the Hospitalists listed on amion for assistance.

## 2020-07-25 NOTE — Progress Notes (Signed)
Subjective: 4 Days Post-Op Procedure(s) (LRB): INCISION AND DRAINAGE ABSCESS (Left) Seen in rounds for Dr. Shon Baton Patient reports pain as mild.  Reports great management of pain with PO pain meds He states that he is working on finding a place to go home to for discharge No complaints this am  Objective: Vital signs in last 24 hours: Temp:  [96.9 F (36.1 C)-98.3 F (36.8 C)] 97.5 F (36.4 C) (07/23 0459) Pulse Rate:  [63-72] (P) 72 (07/22 2000) Resp:  [14-23] (P) 18 (07/22 2000) BP: (155-163)/(63-86) (P) 126/74 (07/22 2000) SpO2:  [98 %-99 %] (P) 98 % (07/22 2000)  Intake/Output from previous day: 07/22 0701 - 07/23 0700 In: -  Out: 1150 [Urine:1150] Intake/Output this shift: No intake/output data recorded.  Recent Labs    07/23/20 0244 07/24/20 0255  HGB 13.2 12.9*   Recent Labs    07/23/20 0244 07/24/20 0255  WBC 15.2* 11.1*  RBC 4.14* 4.00*  HCT 39.6 38.1*  PLT 293 279   Recent Labs    07/23/20 0244 07/25/20 0157  NA 141 139  K 3.7 3.7  CL 103 101  CO2 29 32  BUN 16 12  CREATININE 0.74 0.74  GLUCOSE 86 87  CALCIUM 8.9 8.9   No results for input(s): LABPT, INR in the last 72 hours.  Physical Exam: Splint intact, clean and dry. Able to move fingers with no pain, no swelling noted in hand NVI in left upper extremity   Assessment/Plan: 4 Days Post-Op Procedure(s) (LRB): INCISION AND DRAINAGE ABSCESS (Left) Patient is doing great from an ortho standpoint, elbow continues to improve Will continue on PO pain meds Waiting for him to find somewhere safe for him to be discharged to. Will follow up with Dr. Shon Baton in the office for splint removal will need to keep it on and dry until then      [facility]  [phone] 07/25/2020, 7:48 AM

## 2020-07-26 LAB — AEROBIC/ANAEROBIC CULTURE W GRAM STAIN (SURGICAL/DEEP WOUND): Culture: NO GROWTH

## 2020-07-26 MED ORDER — LISINOPRIL 20 MG PO TABS
20.0000 mg | ORAL_TABLET | Freq: Every day | ORAL | Status: DC
  Administered 2020-07-26 – 2020-08-03 (×9): 20 mg via ORAL
  Filled 2020-07-26 (×9): qty 1

## 2020-07-26 NOTE — Evaluation (Signed)
Occupational Therapy Evaluation Patient Details Name: [name] MRN: [id number] DOB: 1985/11/19 Today's Date: 07/26/2020    History of Present Illness Patient is a 35 year old male s/p I and D of L elbow due to Septic joint 2* group A strep. no listed PMH in chart   Clinical Impression   Patient is very independent at baseline, works in [employer] representative. Currently working on a place to stay as he and fiancee are no longer together. Patient educated in ROM for L UE as well as edema management to elevate limb, please see exercise section listed below. Patient currently in posterior splint and ace wrap limiting AROM to L elbow, encourage patient to mobilize within pain tolerance once this is removed during follow up appointment with Dr. Shon Baton. Patient also endorsing some residual R flank pain due to rib fractures, educate patient on how to brace with pillow and grade activity as needed. Encouraged patient to follow up with doctor for any workplace restrictions while healing from current injuries as he does have very physical job. All education has been completed, no further acute OT needs at this time. Please re-consult if new needs arise.    Follow Up Recommendations  Follow surgeon's recommendation for DC plan and follow-up therapies    Equipment Recommendations  None recommended by OT       Precautions / Restrictions Precautions Precautions: None Restrictions Weight Bearing Restrictions: No Other Position/Activity Restrictions: posterior splint L UE elbow      Mobility Bed Mobility               General bed mobility comments: in chair    Transfers Overall transfer level: Independent                    Balance Overall balance assessment: Independent                                         ADL either performed or assessed with clinical judgement   ADL Overall ADL's : Independent                                        General ADL Comments: patient has been ambulating in hallway, ambulating to the bathroom, able to feed himself and perform self care tasks without any physical assistance. does endorse some R flank pain due to healing rib fracutes. Educate patient on ways to modify activity or use pillow to brace. 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 limited elbow ROM due to posterior splint and ace wrap. Patient states "once this is taken off I feel like I can move my elbow" LUE Sensation: WNL LUE Coordination: WNL   Lower Extremity Assessment Lower Extremity Assessment: Overall WFL for tasks assessed   Cervical / Trunk Assessment  Cervical / Trunk Assessment: Normal   Communication Communication Communication: No difficulties   Cognition Arousal/Alertness: Awake/alert Behavior During Therapy: WFL for tasks assessed/performed Overall Cognitive Status: Within Functional Limits for tasks assessed                                        Exercises Exercises: Other exercises Other Exercises Other Exercises: educate patient to elevate L UE to promote circulation prevent any swelling currently do not note any L UE edema. also educate patient move hand, wrist, and shoulder ad lib throughout the day to prevent any stiffness in joints. Patient verbalize understanding and reports he has gone through similar injury with both legs and other arm in the past. Also  encourage elbow ROM to tolerance once posterior splint is doffed, is currently limited to how much ROM exercise he can perform because of this.        Home Living Family/patient expects to be discharged to:: Unsure                                 Additional Comments: patient recently separated from fiancee and is working on trying to stay with friends      Prior Functioning/Environment Level of Independence: Independent        Comments: works in Garment/textile technologist Problem List: Pain;Impaired UE functional use         OT Goals(Current goals can be found in the care plan section) Acute Rehab OT Goals Patient Stated Goal: find a place to stay OT Goal Formulation: All assessment and education complete, DC therapy   AM-PAC OT "6 Clicks" Daily Activity     Outcome Measure Help from another person eating meals?: None Help from another person taking care of personal grooming?: None Help from another person toileting, which includes using toliet, bedpan, or urinal?: None Help from another person bathing (including washing, rinsing, drying)?: None Help from another person to put on and taking off regular upper body clothing?: None Help from another person to put on and taking off regular lower body clothing?: None 6 Click Score: 24   End of Session    Activity Tolerance:   Patient left:    OT Visit Diagnosis: Pain Pain - Right/Left: Left Pain - part of body: Arm (R flank)                Time: 5035-4656 OT Time Calculation (min): 13 min Charges:  OT General Charges $OT Visit: 1 Visit OT Evaluation $OT Eval Low Complexity: 1 Low  [name] OT OT pager: [phone]  [name] 07/26/2020, 11:05 AM

## 2020-07-26 NOTE — Progress Notes (Addendum)
PROGRESS NOTE    ANDREU DRUDGE   QHU:[id number]  DOB: Nov 12, 1985  PCP: Patient, No Pcp Per (Inactive)    DOA: 07/20/2020 LOS: 5   Assessment & Plan   Principal Problem:   Septic joint of left elbow (HCC) Active Problems:   Septic arthritis (HCC)   Septic arthritis of elbow, left (HCC)   Essential hypertension   Septic joint of left elbow - Culture grew group A strep.   --2 weeks of IV penicillin -last 08/04/2018.   --D/w ID, okay to DC on Rocephin 2 g q24h if pt to d/c to location Home health unable to service (he may go to a motel) --Cleared by orthopedic surgery.   --Discharge barrier has been pt newly separated from fiance and unable to return home, now homeless.  He reports likely able to stay w a friend for a couple of days, and boss has offered to put up in hotel.  Patient continues to work on this --TOC following --HH infusion service for IV penicillin infusion are arranged --D/C location must have refrigeration for storage of Abx --Follow up in clinic with Dr. Luciana Axe on 08/03/20 at 11:15 AM   Hypertension -patient has several major life stressors ongoing that likely contribute.  No previous history of HTN.  Treated with Lasix given aggressive fluid resuscitation and started lisinopril. 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  No rib fractures seen on xrays.  No relief with lidocaine.  Trial of Voltaren gel.  Continue oral pain med PRN.      Patient BMI: Body mass index is 24.21 kg/m.   DVT prophylaxis: SCDs Start: 07/21/20 1438 SCDs Start: 07/21/20 0740   Diet:  Diet Orders (From admission, onward)     Start     Ordered   07/21/20 1626  Diet regular Room service appropriate? Yes; Fluid consistency: Thin  Diet effective now       Question Answer Comment   Room service appropriate? Yes   Fluid consistency: Thin      07/21/20 1626              Code Status: Full Code   Brief Narrative / Hospital Course to Date:   "35 year old male without any major past medical history presented to the emergency room on the morning of 7/19 with 36-hour history of left elbow pain and swelling.  Ultrasound noted purulent material.  Admitted to the hospital service for septic arthritis of left elbow joint.  Orthopedic surgery took patient to the OR later that morning for I&D with culture sent.  Patient initially placed in stepdown unit.  Cultures came back for group A strep and patient changed to IV penicillin, with infectious disease recommending 2 weeks of IV therapy.  PICC line placed.  It was discovered that patient broke up with his fiance and now does not have a safe place to go for discharge.   This morning, white blood cell count down to 11.1.  Since admission, patient's blood pressure has been elevated.  No previous history of hypertension.  Patient continues to complain of some discomfort on his right side of his chest, especially with deep inspiration.  He says it limits him from taking a full inspiratory effort in with the incentive spirometer.  Chest x-ray notes small right  pleural effusion and no evidence of pneumothorax."  Subjective 07/26/20    Pt up in chair when seen this AM.  Reports rib cage pain currently controlled.  Denies any fevers or chills.  Was going to go stay at his mother's home but she and everyone who live there having to move out apparently.  He hopes to know where he will be able to discharge to by noon today.  Denies other acute complaints.  Has not tried Voltaren gel yet on the rib cage.   Disposition Plan & Communication   Status is: Inpatient  Remains inpatient appropriate because:IV treatments appropriate due to intensity of illness or inability to take PO.  Unsafe d/c plan for getting IV antibiotics.    Dispo: The  patient is from: Home              Anticipated d/c is to:  TBD               Patient currently is medically stable to d/c.   Difficult to place patient No     Consults, Procedures, Significant Events   Consultants:  Orthopedic surgery Infectious disease  Procedures:  Status post I&D of left elbow abscess 7/19 Placement of PICC line 7/20  Antimicrobials:  Anti-infectives (From admission, onward)    Start     Dose/Rate Route Frequency Ordered Stop   07/22/20 1900  penicillin G potassium 12 Million Units in dextrose 5 % 500 mL continuous infusion        12 Million Units 41.7 mL/hr over 12 Hours Intravenous Every 12 hours 07/22/20 1110     07/22/20 0600  cefTRIAXone (ROCEPHIN) 2 g in sodium chloride 0.9 % 100 mL IVPB  Status:  Discontinued        2 g 200 mL/hr over 30 Minutes Intravenous Every 24 hours 07/21/20 0741 07/22/20 1110   07/21/20 1800  vancomycin (VANCOCIN) IVPB 1000 mg/200 mL premix  Status:  Discontinued        1,000 mg 200 mL/hr over 60 Minutes Intravenous Every 12 hours 07/21/20 0808 07/22/20 1110   07/21/20 0415  vancomycin (VANCOREADY) IVPB 1250 mg/250 mL        1,250 mg 166.7 mL/hr over 90 Minutes Intravenous STAT 07/21/20 0401 07/21/20 0650   07/21/20 0400  cefTRIAXone (ROCEPHIN) 2 g in sodium chloride 0.9 % 100 mL IVPB        2 g 200 mL/hr over 30 Minutes Intravenous  Once 07/21/20 0358 07/21/20 0513         Micro    Objective   Vitals:   07/25/20 2148 07/26/20 0157 07/26/20 0640 07/26/20 1325  BP: (!) 181/100 130/64 (!) 186/115 (!) 160/84  Pulse: 73 (!) 58 70 68  Resp: 18 18 17 18   Temp: 98.8 F (37.1 C) 98.3 F (36.8 C) 98.6 F (37 C) 97.8 F (36.6 C)  TempSrc: Oral  Oral Oral  SpO2: 98% 100% 100% 100%  Weight:      Height:        Intake/Output Summary (Last 24 hours) at 07/26/2020 1508 Last data filed at 07/26/2020 1400 Gross per 24 hour  Intake 1240.8 ml  Output 0 ml  Net 1240.8 ml   Filed Weights   07/21/20 0753  Weight: 66 kg     Physical Exam:  General exam: awake, alert, no acute distress Respiratory system: normal respiratory effort, on room air. Cardiovascular system: RRR, no pedal edema.   Central nervous system: grossly non-focal exam, normal speech Extremities: left  upper extremity in ACE wrap with splint, no deformities seen, no edema Psychiatry: normal mood, congruent affect, judgement and insight appear normal  Labs   Data Reviewed: I have personally reviewed following labs and imaging studies  CBC: Recent Labs  Lab 07/20/20 2330 07/22/20 0259 07/23/20 0244 07/24/20 0255  WBC 27.6* 27.8* 15.2* 11.1*  NEUTROABS 24.0*  --   --   --   HGB 16.0 14.7 13.2 12.9*  HCT 47.3 44.4 39.6 38.1*  MCV 95.6 95.7 95.7 95.3  PLT 311 278 293 279   Basic Metabolic Panel: Recent Labs  Lab 07/20/20 2330 07/22/20 0259 07/23/20 0244 07/25/20 0157  NA 134* 139 141 139  K 3.9 4.2 3.7 3.7  CL 101 104 103 101  CO2 23 29 29  32  GLUCOSE 99 135* 86 87  BUN 13 15 16 12   CREATININE 0.94 0.85 0.74 0.74  CALCIUM 9.4 8.9 8.9 8.9   GFR: Estimated Creatinine Clearance: 113.2 mL/min (by C-G formula based on SCr of 0.74 mg/dL). Liver Function Tests: Recent Labs  Lab 07/20/20 2330  AST 28  ALT 27  ALKPHOS 72  BILITOT 1.2  PROT 8.1  ALBUMIN 3.9   No results for input(s): LIPASE, AMYLASE in the last 168 hours. No results for input(s): AMMONIA in the last 168 hours. Coagulation Profile: No results for input(s): INR, PROTIME in the last 168 hours. Cardiac Enzymes: No results for input(s): CKTOTAL, CKMB, CKMBINDEX, TROPONINI in the last 168 hours. BNP (last 3 results) No results for input(s): PROBNP in the last 8760 hours. HbA1C: No results for input(s): HGBA1C in the last 72 hours. CBG: Recent Labs  Lab 07/24/20 2321  GLUCAP 102*   Lipid Profile: No results for input(s): CHOL, HDL, LDLCALC, TRIG, CHOLHDL, LDLDIRECT in the last 72 hours. Thyroid Function Tests: No results for input(s): TSH,  T4TOTAL, FREET4, T3FREE, THYROIDAB in the last 72 hours. Anemia Panel: No results for input(s): VITAMINB12, FOLATE, FERRITIN, TIBC, IRON, RETICCTPCT in the last 72 hours. Sepsis Labs: Recent Labs  Lab 07/20/20 2330 07/21/20 1725 07/21/20 2002  LATICACIDVEN 0.8 1.2 1.0    Recent Results (from the past 240 hour(s))  Body fluid culture w Gram Stain     Status: None   Collection Time: 07/21/20  3:59 AM   Specimen: Synovium; Body Fluid  Result Value Ref Range Status   Specimen Description   Final    SYNOVIAL LT ELBOW Performed at [facility], [address] [place], [place]    Special Requests   Final    NONE Performed at [facility], [address], [place], [place]    Gram Stain   Final    FEW WBC PRESENT,BOTH PMN AND MONONUCLEAR NO ORGANISMS SEEN    Culture   Final    RARE STREPTOCOCCUS PYOGENES CRITICAL RESULT CALLED TO, READ BACK BY AND VERIFIED WITH: PHARMD T GREEN [place] AT 1033 BY CM Performed at [facility], [address], [place], [address] [place]    Report Status 07/23/2020 FINAL  Final   Organism ID, Bacteria STREPTOCOCCUS PYOGENES  Final      Susceptibility   Streptococcus pyogenes - MIC*    PENICILLIN <=0.06 SENSITIVE Sensitive     CEFTRIAXONE <=0.12 SENSITIVE Sensitive     ERYTHROMYCIN <=0.12 SENSITIVE Sensitive     LEVOFLOXACIN 0.5 SENSITIVE Sensitive     VANCOMYCIN <=0.12 SENSITIVE Sensitive     * RARE STREPTOCOCCUS PYOGENES  Resp Panel by RT-PCR (Flu  A&B, Covid) Nasopharyngeal Swab     Status: None   Collection Time: 07/21/20  5:24 AM   Specimen: Nasopharyngeal Swab; Nasopharyngeal(NP) swabs in vial transport medium  Result Value Ref Range Status   SARS Coronavirus 2 by RT PCR NEGATIVE NEGATIVE Final    Comment: (NOTE) SARS-CoV-2 target nucleic acids are NOT DETECTED.  The SARS-CoV-2 RNA is generally detectable in upper respiratory specimens during the acute phase of infection. The  lowest concentration of SARS-CoV-2 viral copies this assay can detect is 138 copies/mL. A negative result does not preclude SARS-Cov-2 infection and should not be used as the sole basis for treatment or other patient management decisions. A negative result may occur with  improper specimen collection/handling, submission of specimen other than nasopharyngeal swab, presence of viral mutation(s) within the areas targeted by this assay, and inadequate number of viral copies(<138 copies/mL). A negative result must be combined with clinical observations, patient history, and epidemiological information. The expected result is Negative.  Fact Sheet for Patients:  BloggerCourse.comhttps://www.fda.gov/media/152166/download  Fact Sheet for Healthcare Providers:  SeriousBroker.ithttps://www.fda.gov/media/152162/download  This test is no t yet approved or cleared by the [place] FDA and  has been authorized for detection and/or diagnosis of SARS-CoV-2 by FDA under an Emergency Use Authorization (EUA). This EUA will remain  in effect (meaning this test can be used) for the duration of the COVID-19 declaration under Section 564(b)(1) of the Act, 21 U.S.C.section 360bbb-3(b)(1), unless the authorization is terminated  or revoked sooner.       Influenza A by PCR NEGATIVE NEGATIVE Final   Influenza B by PCR NEGATIVE NEGATIVE Final    Comment: (NOTE) The Xpert Xpress SARS-CoV-2/FLU/RSV plus assay is intended as an aid in the diagnosis of influenza from Nasopharyngeal swab specimens and should not be used as a sole basis for treatment. Nasal washings and aspirates are unacceptable for Xpert Xpress SARS-CoV-2/FLU/RSV testing.  Fact Sheet for Patients: BloggerCourse.comhttps://www.fda.gov/media/152166/download  Fact Sheet for Healthcare Providers: SeriousBroker.ithttps://www.fda.gov/media/152162/download  This test is not yet approved or cleared by the [place] FDA and has been authorized for detection and/or diagnosis of SARS-CoV-2 by FDA under  an Emergency Use Authorization (EUA). This EUA will remain in effect (meaning this test can be used) for the duration of the COVID-19 declaration under Section 564(b)(1) of the Act, 21 U.S.C. section 360bbb-3(b)(1), unless the authorization is terminated or revoked.  Performed at [facility], [address] [place], [place] [address]   Aerobic/Anaerobic Culture w Gram Stain (surgical/deep wound)     Status: None   Collection Time: 07/21/20 10:37 AM   Specimen: PATH Cytology Misc. fluid; Body Fluid  Result Value Ref Range Status   Specimen Description   Final    ABSCESS LEFT ELBOW Performed at [facility], [address] [place], [place] [address]    Special Requests   Final    NONE Performed at [facility], [address] [place], [place] [address]    Gram Stain MODERATE WBC PRESENT,BOTH PMN AND MONONUCLEAR  Final   Culture   Final    No growth aerobically or anaerobically. Performed at [facility], [address] [place], [place] [address]    Report Status 07/26/2020 FINAL  Final  MRSA Next Gen by PCR, Nasal     Status: None   Collection Time: 07/21/20  5:50 PM   Specimen: Nasal Mucosa; Nasal Swab  Result Value Ref Range Status   MRSA by PCR Next Gen NOT DETECTED  NOT DETECTED Final    Comment: (NOTE) The GeneXpert MRSA Assay (FDA approved for NASAL specimens only), is one component of a comprehensive MRSA colonization surveillance program. It is not intended to diagnose MRSA infection nor to guide or monitor treatment for MRSA infections. Test performance is not FDA approved in patients less than 84 years old. Performed at [facility], [address], [place], [place] [address]       Imaging Studies   No results found.   Medications   Scheduled Meds:  Chlorhexidine Gluconate Cloth  6 each Topical Daily   diclofenac Sodium  2 g Topical QID   docusate sodium  100 mg Oral BID    lidocaine  1 patch Transdermal Q24H   lisinopril  20 mg Oral Daily   pantoprazole  40 mg Oral Daily   sodium chloride flush  10-40 mL Intracatheter Q12H   Continuous Infusions:  methocarbamol (ROBAXIN) IV     penicillin g continuous IV infusion 12 Million Units (07/26/20 0928)       LOS: 5 days    Time spent: 25 minutes with > 50% spent at bedside and in coordination of care     Pennie Banter, DO [facility]  07/26/2020, 3:08 PM      If 7PM-7AM, please contact night-coverage. How to contact the [facility] Attending or Consulting provider 7A - 7P or covering provider during after hours 7P -7A, for this patient?    Check the care team in [facility] and look for a) attending/consulting [facility] provider listed and b) the [facility] team listed Log into [email or web address] and use [place]'s universal password to access. If you do not have the password, please contact the hospital operator. Locate the [facility] provider you are looking for under [facility] and page to a number that you can be directly reached. If you still have difficulty reaching the provider, please page the [facility] (Director on Call) for the Hospitalists listed on amion for assistance.

## 2020-07-27 NOTE — Progress Notes (Signed)
Subjective: 6 Days Post-Op Procedure(s) (LRB): INCISION AND DRAINAGE ABSCESS (Left) Patient reports pain as  minimal . No complaints.  Eating well, no N/V Denies sweats/chills.    Objective: Vital signs in last 24 hours: Temp:  [97.8 F (36.6 C)-98.5 F (36.9 C)] 98 F (36.7 C) (07/25 0448) Pulse Rate:  [67-73] 67 (07/25 0448) Resp:  [17-18] 17 (07/25 0448) BP: (127-160)/(72-87) 127/72 (07/25 0448) SpO2:  [98 %-100 %] 98 % (07/25 0448)  Intake/Output from previous day: 07/24 0701 - 07/25 0700 In: 2217.7 [P.O.:720; IV Piggyback:1497.7] Out: 0  Intake/Output this shift: Total I/O In: 480 [P.O.:480] Out: 2 [Urine:1; Stool:1]  No results for input(s): HGB in the last 72 hours. No results for input(s): WBC, RBC, HCT, PLT in the last 72 hours. Recent Labs    07/25/20 0157  NA 139  K 3.7  CL 101  CO2 32  BUN 12  CREATININE 0.74  GLUCOSE 87  CALCIUM 8.9   No results for input(s): LABPT, INR in the last 72 hours.  Neurologically intact Neurovascular intact Sensation intact distally Intact pulses distally Incision: dressing C/D/I, no drainage, and splint and dressing removed today. Incision is C/D/I without any drainage.    Assessment/Plan: 6 Days Post-Op Procedure(s) (LRB): INCISION AND DRAINAGE ABSCESS (Left)  Splint and dressing removed today. Will keep in sutures and keep covered with 4x4 and tape. May begin to do gentle ROM with OT to prevent stiffness.  Pain well controlled.   Cleared for D/C from an ortho stand point once patient has safe D/C plan. We will plan to see him in the office for f/u.   [name] 07/27/2020, 12:15 PM

## 2020-07-27 NOTE — Progress Notes (Signed)
PROGRESS NOTE    [name]  OZY:[id number] DOB: July 13, 1985 DOA: 07/20/2020 PCP: Patient, No Pcp Per (Inactive)    Brief Narrative:  [name] is a 35 year old male with no previous past medical history of significance who presented to [facility] ED on 7/18 with progressive left elbow pain and swelling over the last 36 hours.  Patient reports subjective feeling of fever/chills; and difficulty with range of motion of left elbow.  Denies trauma or insect bite.  No rashes.  In the ED, the [facility] count elevated 27,000, left elbow x-ray with no acute findings.  The left elbow was aspirated by EDP and reported purulence.  Orthopedics, Dr. Shon Baton was consulted.  Blood cultures x2 were obtained and patient was started on empiric antibiotics.  TRH consulted for further evaluation and management of septic joint.   Assessment & Plan:   Principal Problem:   Septic joint of left elbow (HCC) Active Problems:   Septic arthritis (HCC)   Septic arthritis of elbow, left (HCC)   Essential hypertension   Left elbow septic arthritis with abscess  Patient presenting to the ED with progressive left elbow pain with associated fever/chills and decreased range of motion.  WBC count elevated 27,000 on admission.  Underwent aspiration of left elbow joint by EDP with purulence.  Patient underwent incision and debridement of left elbow by orthopedics, Dr. Shon Baton on 07/21/2020.  Left elbow culture with group A streptococcus.  Was seen by infectious disease and recommended 2-week IV antibiotic course following operative management with incision and drainage. --Continue IV penicillin, end date 08/03/2020 --Okay for consult ROM with OT to prevent stiffness; splint/dressing removed 7/25; per ortho --Ortho plans to see in office for outpatient follow-up --Difficult disposition as patient now homeless, with no safe discharge plan as patient needs IV antibiotics daily with access to refrigeration; may need to remain  inpatient to complete antibiotic course  Essential hypertension No previous history.  Patient with several major life stressors ongoing are likely contributing. BP 127/72 this morning, controlled --Continue lisinopril 20 mg p.o. 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  No rib fracture noted on x-ray.  No relief with lidocaine patch or Voltaren gel. --Norco 7.5-325mg  PO every 8 hours as needed refractory pain --Toradol 30 mg IV every 8 hours as needed --Robaxin 5 mg p.o. every 6 hours as needed muscle spasms --Continue lidocaine patch and Voltaren gel   DVT prophylaxis: SCDs Start: 07/21/20 1438 SCDs Start: 07/21/20 0740   Code Status: Full Code Family Communication: No family present at bedside this morning  Disposition Plan:  Level of care: Med-Surg Status is: Inpatient  Remains inpatient appropriate because:Unsafe d/c plan and IV treatments appropriate due to intensity of illness or inability to take PO  Dispo: The patient is from: Home              Anticipated d/c is to: Home              Patient currently is not medically stable to d/c.   Difficult to place patient No   Consultants:  Infectious disease Orthopedics, Dr. Shon Baton  Procedures:  Incision and drainage left elbow, Dr. Shon Baton 7/19  Antimicrobials:  IV penicillin 7/20>> Vancomycin 7/19 - 7/20 Ceftriaxone 7/19 - 7/20   Subjective: Patient seen examined at bedside, resting comfortably.  Sitting in bedside chair.  Continues to complain of right-sided rib pain, states lidocaine  patch and Voltaren gel not effective.  Still with no reasonable discharge options as he is now homeless and requiring IV antibiotics until 8/1.  Seen by orthopedics this morning and splints removed with recommendation of starting gentle ROM with OT to prevent left elbow stiffness.  No other questions or concerns at this time.  Denies  headache, no fever/chills/night sweats, no nausea/vomiting/diarrhea, no chest pain, no palpitations, no shortness of breath, no abdominal pain, no weakness, no fatigue, no paresthesias.  No acute events overnight per nursing staff.  Objective: Vitals:   07/26/20 0640 07/26/20 1325 07/26/20 2109 07/27/20 0448  BP: (!) 186/115 (!) 160/84 (!) 159/87 127/72  Pulse: 70 68 73 67  Resp: 17 18 18 17   Temp: 98.6 F (37 C) 97.8 F (36.6 C) 98.5 F (36.9 C) 98 F (36.7 C)  TempSrc: Oral Oral Oral Oral  SpO2: 100% 100% 98% 98%  Weight:      Height:        Intake/Output Summary (Last 24 hours) at 07/27/2020 1425 Last data filed at 07/27/2020 1100 Gross per 24 hour  Intake 1623.73 ml  Output 2 ml  Net 1621.73 ml   Filed Weights   07/21/20 0753  Weight: 66 kg    Examination:  General exam: Appears calm and comfortable  Respiratory system: Clear to auscultation. Respiratory effort normal.  On room air Cardiovascular system: S1 & S2 heard, RRR. No JVD, murmurs, rubs, gallops or clicks. No pedal edema. Gastrointestinal system: Abdomen is nondistended, soft and nontender. No organomegaly or masses felt. Normal bowel sounds heard. Central nervous system: Alert and oriented. No focal neurological deficits. Extremities: Symmetric 5 x 5 power.  Noted left upper extremity with splint/dressing in place.  Neurovascularly intact. Skin: No rashes, lesions or ulcers Psychiatry: Judgement and insight appear normal. Mood & affect appropriate.     Data Reviewed: I have personally reviewed following labs and imaging studies  CBC: Recent Labs  Lab 07/20/20 2330 07/22/20 0259 07/23/20 0244 07/24/20 0255  WBC 27.6* 27.8* 15.2* 11.1*  NEUTROABS 24.0*  --   --   --   HGB 16.0 14.7 13.2 12.9*  HCT 47.3 44.4 39.6 38.1*  MCV 95.6 95.7 95.7 95.3  PLT 311 278 293 279   Basic Metabolic Panel: Recent Labs  Lab 07/20/20 2330 07/22/20 0259 07/23/20 0244 07/25/20 0157  NA 134* 139 141 139  K 3.9  4.2 3.7 3.7  CL 101 104 103 101  CO2 23 29 29  32  GLUCOSE 99 135* 86 87  BUN 13 15 16 12   CREATININE 0.94 0.85 0.74 0.74  CALCIUM 9.4 8.9 8.9 8.9   GFR: Estimated Creatinine Clearance: 113.2 mL/min (by C-G formula based on SCr of 0.74 mg/dL). Liver Function Tests: Recent Labs  Lab 07/20/20 2330  AST 28  ALT 27  ALKPHOS 72  BILITOT 1.2  PROT 8.1  ALBUMIN 3.9   No results for input(s): LIPASE, AMYLASE in the last 168 hours. No results for input(s): AMMONIA in the last 168 hours. Coagulation Profile: No results for input(s): INR, PROTIME in the last 168 hours. Cardiac Enzymes: No results for input(s): CKTOTAL, CKMB, CKMBINDEX, TROPONINI in the last 168 hours. BNP (last 3 results) No results for input(s): PROBNP in the last 8760 hours. HbA1C: No results for input(s): HGBA1C in the last 72 hours. CBG: Recent Labs  Lab 07/24/20 2321  GLUCAP 102*   Lipid Profile: No results for input(s): CHOL, HDL, LDLCALC, TRIG, CHOLHDL, LDLDIRECT in the last 72 hours. Thyroid  Function Tests: No results for input(s): TSH, T4TOTAL, FREET4, T3FREE, THYROIDAB in the last 72 hours. Anemia Panel: No results for input(s): VITAMINB12, FOLATE, FERRITIN, TIBC, IRON, RETICCTPCT in the last 72 hours. Sepsis Labs: Recent Labs  Lab 07/20/20 2330 07/21/20 1725 07/21/20 2002  LATICACIDVEN 0.8 1.2 1.0    Recent Results (from the past 240 hour(s))  Body fluid culture w Gram Stain     Status: None   Collection Time: 07/21/20  3:59 AM   Specimen: Synovium; Body Fluid  Result Value Ref Range Status   Specimen Description   Final    SYNOVIAL LT ELBOW Performed at [facility], [address], [place], [place] [address]    Special Requests   Final    NONE Performed at [facility], [address] [place], [place] [address]    Gram Stain   Final    FEW WBC PRESENT,BOTH PMN AND MONONUCLEAR NO ORGANISMS SEEN    Culture   Final    RARE STREPTOCOCCUS  PYOGENES CRITICAL RESULT CALLED TO, READ BACK BY AND VERIFIED WITH: PHARMD T GREEN [id number] AT 1033 BY CM Performed at [facility], [address], [place], [place] [address]    Report Status 07/23/2020 FINAL  Final   Organism ID, Bacteria STREPTOCOCCUS PYOGENES  Final      Susceptibility   Streptococcus pyogenes - MIC*    PENICILLIN <=0.06 SENSITIVE Sensitive     CEFTRIAXONE <=0.12 SENSITIVE Sensitive     ERYTHROMYCIN <=0.12 SENSITIVE Sensitive     LEVOFLOXACIN 0.5 SENSITIVE Sensitive     VANCOMYCIN <=0.12 SENSITIVE Sensitive     * RARE STREPTOCOCCUS PYOGENES  Resp Panel by RT-PCR (Flu A&B, Covid) Nasopharyngeal Swab     Status: None   Collection Time: 07/21/20  5:24 AM   Specimen: Nasopharyngeal Swab; Nasopharyngeal(NP) swabs in vial transport medium  Result Value Ref Range Status   SARS Coronavirus 2 by RT PCR NEGATIVE NEGATIVE Final    Comment: (NOTE) SARS-CoV-2 target nucleic acids are NOT DETECTED.  The SARS-CoV-2 RNA is generally detectable in upper respiratory specimens during the acute phase of infection. The lowest concentration of SARS-CoV-2 viral copies this assay can detect is 138 copies/mL. A negative result does not preclude SARS-Cov-2 infection and should not be used as the sole basis for treatment or other patient management decisions. A negative result may occur with  improper specimen collection/handling, submission of specimen other than nasopharyngeal swab, presence of viral mutation(s) within the areas targeted by this assay, and inadequate number of viral copies(<138 copies/mL). A negative result must be combined with clinical observations, patient history, and epidemiological information. The expected result is Negative.  Fact Sheet for Patients:  BloggerCourse.com  Fact Sheet for Healthcare Providers:  SeriousBroker.it  This test is no t yet approved or cleared by the [place] FDA and  has  been authorized for detection and/or diagnosis of SARS-CoV-2 by FDA under an Emergency Use Authorization (EUA). This EUA will remain  in effect (meaning this test can be used) for the duration of the COVID-19 declaration under Section 564(b)(1) of the Act, 21 U.S.C.section 360bbb-3(b)(1), unless the authorization is terminated  or revoked sooner.       Influenza A by PCR NEGATIVE NEGATIVE Final   Influenza B by PCR NEGATIVE NEGATIVE Final    Comment: (NOTE) The Xpert Xpress SARS-CoV-2/FLU/RSV plus assay is intended as an aid in the diagnosis of influenza from Nasopharyngeal swab specimens and should not be used as  a sole basis for treatment. Nasal washings and aspirates are unacceptable for Xpert Xpress SARS-CoV-2/FLU/RSV testing.  Fact Sheet for Patients: BloggerCourse.com  Fact Sheet for Healthcare Providers: SeriousBroker.it  This test is not yet approved or cleared by the [place] FDA and has been authorized for detection and/or diagnosis of SARS-CoV-2 by FDA under an Emergency Use Authorization (EUA). This EUA will remain in effect (meaning this test can be used) for the duration of the COVID-19 declaration under Section 564(b)(1) of the Act, 21 U.S.C. section 360bbb-3(b)(1), unless the authorization is terminated or revoked.  Performed at [facility], [address] [place], [place] [address]   Aerobic/Anaerobic Culture w Gram Stain (surgical/deep wound)     Status: None   Collection Time: 07/21/20 10:37 AM   Specimen: PATH Cytology Misc. fluid; Body Fluid  Result Value Ref Range Status   Specimen Description   Final    ABSCESS LEFT ELBOW Performed at [facility], [address] [place], [place] [address]    Special Requests   Final    NONE Performed at [facility], [address], [place], [place] [address]    Gram Stain MODERATE WBC PRESENT,BOTH  PMN AND MONONUCLEAR  Final   Culture   Final    No growth aerobically or anaerobically. Performed at [facility], [address], [place], [place] [address]    Report Status 07/26/2020 FINAL  Final  MRSA Next Gen by PCR, Nasal     Status: None   Collection Time: 07/21/20  5:50 PM   Specimen: Nasal Mucosa; Nasal Swab  Result Value Ref Range Status   MRSA by PCR Next Gen NOT DETECTED NOT DETECTED Final    Comment: (NOTE) The GeneXpert MRSA Assay (FDA approved for NASAL specimens only), is one component of a comprehensive MRSA colonization surveillance program. It is not intended to diagnose MRSA infection nor to guide or monitor treatment for MRSA infections. Test performance is not FDA approved in patients less than 66 years old. Performed at [facility], [address], [place], [place] [address]          Radiology Studies: No results found.      Scheduled Meds:  Chlorhexidine Gluconate Cloth  6 each Topical Daily   diclofenac Sodium  2 g Topical QID   docusate sodium  100 mg Oral BID   lidocaine  1 patch Transdermal Q24H   lisinopril  20 mg Oral Daily   pantoprazole  40 mg Oral Daily   sodium chloride flush  10-40 mL Intracatheter Q12H   Continuous Infusions:  methocarbamol (ROBAXIN) IV     penicillin g continuous IV infusion 12 Million Units (07/27/20 1119)     LOS: 6 days    Time spent: 39 minutes spent on chart review, discussion with nursing staff, consultants, updating family and interview/physical exam; more than 50% of that time was spent in counseling and/or coordination of care.    Alvira Philips [place], DO [facility] Available via Epic secure chat 7am-7pm After these hours, please refer to coverage provider listed on [email or web address] 07/27/2020, 2:25 PM

## 2020-07-28 MED ORDER — HYDRALAZINE HCL 25 MG PO TABS
25.0000 mg | ORAL_TABLET | Freq: Four times a day (QID) | ORAL | Status: DC | PRN
  Administered 2020-07-28: 25 mg via ORAL
  Filled 2020-07-28: qty 1

## 2020-07-28 NOTE — Progress Notes (Signed)
PROGRESS NOTE    Marcus BoersKelvin D Nelson  ZOX:[id number] DOB: 1985/10/27 DOA: 07/20/2020 PCP: Patient, No Pcp Per (Inactive)    Brief Narrative:  [name] is a 35 year old male with no previous past medical history of significance who [facility]/18 with progressive left elbow pain and swelling over the last 36 hours.  Patient reports subjective feeling of fever/chills; and difficulty with range of motion of left elbow.  Denies trauma or insect bite.  No rashes.  [facility] left elbow x-ray with no acute findings.  The left elbow was aspirated by EDP and reported purulence.  Orthopedics, Dr. Shon BatonBrooks was consulted.  Blood cultures x2 were obtained and patient was started on empiric antibiotics.  TRH consulted for further evaluation and management of septic joint.   Assessment & Plan:   Principal Problem:   Septic joint of left elbow (HCC) Active Problems:   Septic arthritis (HCC)   Septic arthritis of elbow, left (HCC)   Essential hypertension   Left elbow septic arthritis with abscess  Patient presenting to the ED with progressive left elbow pain with associated fever/chills and decreased range of motion.  WBC count elevated 27,000 on admission.  Underwent aspiration of left elbow joint by EDP with purulence.  Patient underwent incision and debridement of left elbow by orthopedics, Dr. Shon BatonBrooks on 07/21/2020.  Left elbow culture with group A streptococcus.  Was seen by infectious disease and recommended 2-week IV antibiotic course following operative management with incision and drainage. --Continue IV penicillin, end date 08/03/2020 --Okay for ROM with OT to prevent stiffness; splint/dressing removed 7/25; per ortho --Ortho plans to see in office for outpatient follow-up --Difficult disposition as patient now homeless, with no safe discharge plan as patient needs IV antibiotics daily with access to refrigeration; may need to remain inpatient to  complete antibiotic course  Essential hypertension No previous history.  Patient with several major life stressors ongoing are likely contributing. BP 141/82 this morning --Continue lisinopril 20 mg p.o. 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  No rib fracture noted on x-ray.  No relief with lidocaine patch or Voltaren gel. --Norco 7.5-325mg  PO every 8 hours as needed refractory pain --Toradol 30 mg IV every 8 hours as needed --Robaxin 5 mg p.o. every 6 hours as needed muscle spasms --Continue lidocaine patch and Voltaren gel   DVT prophylaxis: SCDs Start: 07/21/20 1438 SCDs Start: 07/21/20 0740   Code Status: Full Code Family Communication: No family present at bedside this morning  Disposition Plan:  Level of care: Med-Surg Status is: Inpatient  Remains inpatient appropriate because:Unsafe d/c plan and IV treatments appropriate due to intensity of illness or inability to take PO  Dispo: The patient is from: Home              Anticipated d/c is to: Home              Patient currently is not medically stable to d/c.   Difficult to place patient No   Consultants:  Infectious disease Orthopedics, Dr. Shon BatonBrooks  Procedures:  Incision and drainage left elbow, Dr. Shon BatonBrooks 7/19  Antimicrobials:  IV penicillin 7/20>> Vancomycin 7/19 - 7/20 Ceftriaxone 7/19 - 7/20   Subjective: Patient seen examined at bedside, resting comfortably.  Sitting in bedside chair.  Eating breakfast, on the telephone trying to order some more breakfast  as well.  No complaints this morning.  Splint removed by orthopedics yesterday.  No other questions or concerns at this time.  No other questions or concerns at this time.  Denies headache, no fever/chills/night sweats, no nausea/vomiting/diarrhea, no chest pain, no palpitations, no shortness of breath, no abdominal pain, no weakness, no fatigue, no  paresthesias.  No acute events overnight per nursing staff.  Objective: Vitals:   07/27/20 1429 07/27/20 2218 07/27/20 2302 07/28/20 0608  BP: (!) 169/91 (!) 169/84 (!) 154/83 (!) 141/82  Pulse: 72 72 70 (!) 57  Resp: 14 18  18   Temp: 99.2 F (37.3 C) 98 F (36.7 C)  98 F (36.7 C)  TempSrc: Oral Oral  Oral  SpO2: 100% 100%  99%  Weight:      Height:        Intake/Output Summary (Last 24 hours) at 07/28/2020 1231 Last data filed at 07/28/2020 0900 Gross per 24 hour  Intake 4004.28 ml  Output --  Net 4004.28 ml   Filed Weights   07/21/20 0753  Weight: 66 kg    Examination:  General exam: Appears calm and comfortable  Respiratory system: Clear to auscultation. Respiratory effort normal.  On room air Cardiovascular system: S1 & S2 heard, RRR. No JVD, murmurs, rubs, gallops or clicks. No pedal edema. Gastrointestinal system: Abdomen is nondistended, soft and nontender. No organomegaly or masses felt. Normal bowel sounds heard. Central nervous system: Alert and oriented. No focal neurological deficits. Extremities: Symmetric 5 x 5 power.  Noted left upper extremity with dressing in place.  Neurovascularly intact. Skin: No rashes, lesions or ulcers Psychiatry: Judgement and insight appear normal. Mood & affect appropriate.     Data Reviewed: I have personally reviewed following labs and imaging studies  CBC: Recent Labs  Lab 07/22/20 0259 07/23/20 0244 07/24/20 0255  WBC 27.8* 15.2* 11.1*  HGB 14.7 13.2 12.9*  HCT 44.4 39.6 38.1*  MCV 95.7 95.7 95.3  PLT 278 293 279   Basic Metabolic Panel: Recent Labs  Lab 07/22/20 0259 07/23/20 0244 07/25/20 0157  NA 139 141 139  K 4.2 3.7 3.7  CL 104 103 101  CO2 29 29 32  GLUCOSE 135* 86 87  BUN 15 16 12   CREATININE 0.85 0.74 0.74  CALCIUM 8.9 8.9 8.9   GFR: Estimated Creatinine Clearance: 113.2 mL/min (by C-G formula based on SCr of 0.74 mg/dL). Liver Function Tests: No results for input(s): AST, ALT, ALKPHOS,  BILITOT, PROT, ALBUMIN in the last 168 hours.  No results for input(s): LIPASE, AMYLASE in the last 168 hours. No results for input(s): AMMONIA in the last 168 hours. Coagulation Profile: No results for input(s): INR, PROTIME in the last 168 hours. Cardiac Enzymes: No results for input(s): CKTOTAL, CKMB, CKMBINDEX, TROPONINI in the last 168 hours. BNP (last 3 results) No results for input(s): PROBNP in the last 8760 hours. HbA1C: No results for input(s): HGBA1C in the last 72 hours. CBG: Recent Labs  Lab 07/24/20 2321  GLUCAP 102*   Lipid Profile: No results for input(s): CHOL, HDL, LDLCALC, TRIG, CHOLHDL, LDLDIRECT in the last 72 hours. Thyroid Function Tests: No results for input(s): TSH, T4TOTAL, FREET4, T3FREE, THYROIDAB in the last 72 hours. Anemia Panel: No results for input(s): VITAMINB12, FOLATE, FERRITIN, TIBC, IRON, RETICCTPCT in the last 72 hours. Sepsis Labs: Recent Labs  Lab 07/21/20 1725 07/21/20 2002  LATICACIDVEN 1.2 1.0    Recent Results (from the past 240 hour(s))  Body fluid culture w Gram Stain  Status: None   Collection Time: 07/21/20  3:59 AM   Specimen: Synovium; Body Fluid  Result Value Ref Range Status   Specimen Description   Final    SYNOVIAL LT ELBOW Performed at [facility], [address], [place], [place] [address]    Special Requests   Final    NONE Performed at [facility], [address] [place], [place] [address]    Gram Stain   Final    FEW WBC PRESENT,BOTH PMN AND MONONUCLEAR NO ORGANISMS SEEN    Culture   Final    RARE STREPTOCOCCUS PYOGENES CRITICAL RESULT CALLED TO, READ BACK BY AND VERIFIED WITH: PHARMD T GREEN [id number] AT 1033 BY CM Performed at [facility], [address], [place], [place] [address]    Report Status 07/23/2020 FINAL  Final   Organism ID, Bacteria STREPTOCOCCUS PYOGENES  Final      Susceptibility   Streptococcus pyogenes - MIC*    PENICILLIN <=0.06  SENSITIVE Sensitive     CEFTRIAXONE <=0.12 SENSITIVE Sensitive     ERYTHROMYCIN <=0.12 SENSITIVE Sensitive     LEVOFLOXACIN 0.5 SENSITIVE Sensitive     VANCOMYCIN <=0.12 SENSITIVE Sensitive     * RARE STREPTOCOCCUS PYOGENES  Resp Panel by RT-PCR (Flu A&B, Covid) Nasopharyngeal Swab     Status: None   Collection Time: 07/21/20  5:24 AM   Specimen: Nasopharyngeal Swab; Nasopharyngeal(NP) swabs in vial transport medium  Result Value Ref Range Status   SARS Coronavirus 2 by RT PCR NEGATIVE NEGATIVE Final    Comment: (NOTE) SARS-CoV-2 target nucleic acids are NOT DETECTED.  The SARS-CoV-2 RNA is generally detectable in upper respiratory specimens during the acute phase of infection. The lowest concentration of SARS-CoV-2 viral copies this assay can detect is 138 copies/mL. A negative result does not preclude SARS-Cov-2 infection and should not be used as the sole basis for treatment or other patient management decisions. A negative result may occur with  improper specimen collection/handling, submission of specimen other than nasopharyngeal swab, presence of viral mutation(s) within the areas targeted by this assay, and inadequate number of viral copies(<138 copies/mL). A negative result must be combined with clinical observations, patient history, and epidemiological information. The expected result is Negative.  Fact Sheet for Patients:  BloggerCourse.com  Fact Sheet for Healthcare Providers:  SeriousBroker.it  This test is no t yet approved or cleared by the [place] FDA and  has been authorized for detection and/or diagnosis of SARS-CoV-2 by FDA under an Emergency Use Authorization (EUA). This EUA will remain  in effect (meaning this test can be used) for the duration of the COVID-19 declaration under Section 564(b)(1) of the Act, 21 U.S.C.section 360bbb-3(b)(1), unless the authorization is terminated  or revoked sooner.        Influenza A by PCR NEGATIVE NEGATIVE Final   Influenza B by PCR NEGATIVE NEGATIVE Final    Comment: (NOTE) The Xpert Xpress SARS-CoV-2/FLU/RSV plus assay is intended as an aid in the diagnosis of influenza from Nasopharyngeal swab specimens and should not be used as a sole basis for treatment. Nasal washings and aspirates are unacceptable for Xpert Xpress SARS-CoV-2/FLU/RSV testing.  Fact Sheet for Patients: BloggerCourse.com  Fact Sheet for Healthcare Providers: SeriousBroker.it  This test is not yet approved or cleared by the [place] FDA and has been authorized for detection and/or diagnosis of SARS-CoV-2 by FDA under an Emergency Use Authorization (EUA). This EUA will remain in effect (meaning this test can  be used) for the duration of the COVID-19 declaration under Section 564(b)(1) of the Act, 21 U.S.C. section 360bbb-3(b)(1), unless the authorization is terminated or revoked.  Performed at [facility], [address], [place], [place] [address]   Aerobic/Anaerobic Culture w Gram Stain (surgical/deep wound)     Status: None   Collection Time: 07/21/20 10:37 AM   Specimen: PATH Cytology Misc. fluid; Body Fluid  Result Value Ref Range Status   Specimen Description   Final    ABSCESS LEFT ELBOW Performed at [facility], [address], [place], [place] [address]    Special Requests   Final    NONE Performed at [facility], [address], [place], [place] [address]    Gram Stain MODERATE WBC PRESENT,BOTH PMN AND MONONUCLEAR  Final   Culture   Final    No growth aerobically or anaerobically. Performed at [facility], [address] [place], [place] [address]    Report Status 07/26/2020 FINAL  Final  MRSA Next Gen by PCR, Nasal     Status: None   Collection Time: 07/21/20  5:50 PM   Specimen: Nasal Mucosa; Nasal Swab  Result Value Ref  Range Status   MRSA by PCR Next Gen NOT DETECTED NOT DETECTED Final    Comment: (NOTE) The GeneXpert MRSA Assay (FDA approved for NASAL specimens only), is one component of a comprehensive MRSA colonization surveillance program. It is not intended to diagnose MRSA infection nor to guide or monitor treatment for MRSA infections. Test performance is not FDA approved in patients less than 58 years old. Performed at [facility], [address] [place], [place] [address]          Radiology Studies: No results found.      Scheduled Meds:  Chlorhexidine Gluconate Cloth  6 each Topical Daily   diclofenac Sodium  2 g Topical QID   docusate sodium  100 mg Oral BID   lidocaine  1 patch Transdermal Q24H   lisinopril  20 mg Oral Daily   pantoprazole  40 mg Oral Daily   sodium chloride flush  10-40 mL Intracatheter Q12H   Continuous Infusions:  methocarbamol (ROBAXIN) IV     penicillin g continuous IV infusion 12 Million Units (07/28/20 1137)     LOS: 7 days    Time spent: 39 minutes spent on chart review, discussion with nursing staff, consultants, updating family and interview/physical exam; more than 50% of that time was spent in counseling and/or coordination of care.    Alvira Philips [place], DO [facility] Available via Epic secure chat 7am-7pm After these hours, please refer to coverage provider listed on [email or web address] 07/28/2020, 12:31 PM

## 2020-07-28 NOTE — TOC Progression Note (Signed)
Transition of Care [facility]) - Progression Note   Patient Details  Name: [name] MRN: [id number] Date of Birth: 03-02-1985  Transition of Care [facility]) CM/SW [place], LCSW Phone Number: 07/28/2020, 2:36 PM  Clinical Narrative: CSW met with patient to discuss patient's living situation before his hospitalization. Per patient, he was living with his mother and brother prior to his admission, but due to a disagreement with his brother he no longer has a place to live at this time and is currently homeless. Patient reported he has called homeless shelters and none have beds available. CSW is unable to try to set up charity IV antibiotics and HHRN as patient does not not have a physical residence in which to provide [facility] services. TOC to follow.  Expected Discharge Plan: Home/Self Care Barriers to Discharge: Continued Medical Work up, Inadequate or no insurance  Expected Discharge Plan and Services Expected Discharge Plan: Home/Self Care Discharge Planning Services: CM Consult Living arrangements for the past 2 months: Single Family Home  Readmission Risk Interventions No flowsheet data found.

## 2020-07-28 NOTE — Progress Notes (Signed)
MD contacted in regards to BP.

## 2020-07-29 NOTE — Progress Notes (Signed)
PROGRESS NOTE    [name]  ALP:[id number] DOB: Mar 12, 1985 DOA: 07/20/2020 PCP: Patient, No Pcp Per (Inactive)    Brief Narrative:  [name] is a 35 year old male with no previous past medical history of significance who presented to [facility] ED on 7/18 with progressive left elbow pain and swelling over the last 36 hours.  Patient reports subjective feeling of fever/chills; and difficulty with range of motion of left elbow.  Denies trauma or insect bite.  No rashes.  In the ED, the [facility] count elevated 27,000, left elbow x-ray with no acute findings.  The left elbow was aspirated by EDP and reported purulence.  Orthopedics, Dr. Shon Baton was consulted.  Blood cultures x2 were obtained and patient was started on empiric antibiotics.  TRH consulted for further evaluation and management of septic joint.   Assessment & Plan:   Principal Problem:   Septic joint of left elbow (HCC) Active Problems:   Septic arthritis (HCC)   Septic arthritis of elbow, left (HCC)   Essential hypertension   Left elbow septic arthritis with abscess  Patient presenting to the ED with progressive left elbow pain with associated fever/chills and decreased range of motion.  WBC count elevated 27,000 on admission.  Underwent aspiration of left elbow joint by EDP with purulence.  Patient underwent incision and debridement of left elbow by orthopedics, Dr. Shon Baton on 07/21/2020.  Left elbow culture with group A streptococcus.  Was seen by infectious disease and recommended 2-week IV antibiotic course following operative management with incision and drainage. --Continue IV penicillin, end date 08/03/2020 --Okay for gentle ROM to prevent stiffness; splint/dressing removed 7/25; per ortho --Ortho plans to see in office for outpatient follow-up --Difficult disposition as patient now homeless, with no safe discharge plan as patient needs IV antibiotics daily with access to refrigeration; may need to remain inpatient to  complete antibiotic course  Essential hypertension No previous history.  Patient with several major life stressors ongoing are likely contributing. BP 128/74 this morning --Continue lisinopril 20 mg p.o. 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  No rib fracture noted on x-ray.  No relief with lidocaine patch or Voltaren gel. --Norco 7.5-325mg  PO every 8 hours as needed refractory pain --Toradol 30 mg IV every 8 hours as needed --Robaxin 5 mg p.o. every 6 hours as needed muscle spasms --Continue lidocaine patch and Voltaren gel   DVT prophylaxis: SCDs Start: 07/21/20 1438 SCDs Start: 07/21/20 0740   Code Status: Full Code Family Communication: No family present at bedside this morning  Disposition Plan:  Level of care: Med-Surg Status is: Inpatient  Remains inpatient appropriate because:Unsafe d/c plan and IV treatments appropriate due to intensity of illness or inability to take PO  Dispo: The patient is from: Home              Anticipated d/c is to: Home              Patient currently is not medically stable to d/c.   Difficult to place patient No   Consultants:  Infectious disease Orthopedics, Dr. Shon Baton  Procedures:  Incision and drainage left elbow, Dr. Shon Baton 7/19  Antimicrobials:  IV penicillin 7/20>> Vancomycin 7/19 - 7/20 Ceftriaxone 7/19 - 7/20   Subjective: Patient seen examined at bedside, resting comfortably.  Sleeping but easily arousable.  No complaints or questions this morning.  Denies headache, no fever/chills/night sweats,  no nausea/vomiting/diarrhea, no chest pain, no palpitations, no shortness of breath, no abdominal pain, no weakness, no fatigue, no paresthesias.  No acute events overnight per nursing staff.  Objective: Vitals:   07/28/20 2008 07/28/20 2140 07/29/20 0629 07/29/20 1343  BP: (!) 191/85 140/68 128/74 (!) 158/92  Pulse: 73 80 (!)  57 72  Resp: 18 18 18 16   Temp: (!) 97.5 F (36.4 C) 98.8 F (37.1 C) 98 F (36.7 C) 97.9 F (36.6 C)  TempSrc: Oral Oral Oral   SpO2: 100% 99% 99% 100%  Weight:      Height:        Intake/Output Summary (Last 24 hours) at 07/29/2020 1522 Last data filed at 07/29/2020 1400 Gross per 24 hour  Intake 2560.81 ml  Output --  Net 2560.81 ml   Filed Weights   07/21/20 0753  Weight: 66 kg    Examination:  General exam: Appears calm and comfortable  Respiratory system: Clear to auscultation. Respiratory effort normal.  On room air Cardiovascular system: S1 & S2 heard, RRR. No JVD, murmurs, rubs, gallops or clicks. No pedal edema. Gastrointestinal system: Abdomen is nondistended, soft and nontender. No organomegaly or masses felt. Normal bowel sounds heard. Central nervous system: Alert and oriented. No focal neurological deficits. Extremities: Symmetric 5 x 5 power.  Noted left upper extremity with dressing in place.  Neurovascularly intact. Skin: No rashes, lesions or ulcers Psychiatry: Judgement and insight appear normal. Mood & affect appropriate.     Data Reviewed: I have personally reviewed following labs and imaging studies  CBC: Recent Labs  Lab 07/23/20 0244 07/24/20 0255  WBC 15.2* 11.1*  HGB 13.2 12.9*  HCT 39.6 38.1*  MCV 95.7 95.3  PLT 293 279   Basic Metabolic Panel: Recent Labs  Lab 07/23/20 0244 07/25/20 0157  NA 141 139  K 3.7 3.7  CL 103 101  CO2 29 32  GLUCOSE 86 87  BUN 16 12  CREATININE 0.74 0.74  CALCIUM 8.9 8.9   GFR: Estimated Creatinine Clearance: 113.2 mL/min (by C-G formula based on SCr of 0.74 mg/dL). Liver Function Tests: No results for input(s): AST, ALT, ALKPHOS, BILITOT, PROT, ALBUMIN in the last 168 hours.  No results for input(s): LIPASE, AMYLASE in the last 168 hours. No results for input(s): AMMONIA in the last 168 hours. Coagulation Profile: No results for input(s): INR, PROTIME in the last 168 hours. Cardiac  Enzymes: No results for input(s): CKTOTAL, CKMB, CKMBINDEX, TROPONINI in the last 168 hours. BNP (last 3 results) No results for input(s): PROBNP in the last 8760 hours. HbA1C: No results for input(s): HGBA1C in the last 72 hours. CBG: Recent Labs  Lab 07/24/20 2321  GLUCAP 102*   Lipid Profile: No results for input(s): CHOL, HDL, LDLCALC, TRIG, CHOLHDL, LDLDIRECT in the last 72 hours. Thyroid Function Tests: No results for input(s): TSH, T4TOTAL, FREET4, T3FREE, THYROIDAB in the last 72 hours. Anemia Panel: No results for input(s): VITAMINB12, FOLATE, FERRITIN, TIBC, IRON, RETICCTPCT in the last 72 hours. Sepsis Labs: No results for input(s): PROCALCITON, LATICACIDVEN in the last 168 hours.   Recent Results (from the past 240 hour(s))  Body fluid culture w Gram Stain     Status: None   Collection Time: 07/21/20  3:59 AM   Specimen: Synovium; Body Fluid  Result Value Ref Range Status   Specimen Description   Final    SYNOVIAL LT ELBOW Performed at [facility], [address], [place], [place]  Special Requests   Final    NONE Performed at [facility], [address] [place], [place] [address]    Gram Stain   Final    FEW WBC PRESENT,BOTH PMN AND MONONUCLEAR NO ORGANISMS SEEN    Culture   Final    RARE STREPTOCOCCUS PYOGENES CRITICAL RESULT CALLED TO, READ BACK BY AND VERIFIED WITH: PHARMD T GREEN [id number] AT 1033 BY CM Performed at [facility], [address] [place], [place] [address]    Report Status 07/23/2020 FINAL  Final   Organism ID, Bacteria STREPTOCOCCUS PYOGENES  Final      Susceptibility   Streptococcus pyogenes - MIC*    PENICILLIN <=0.06 SENSITIVE Sensitive     CEFTRIAXONE <=0.12 SENSITIVE Sensitive     ERYTHROMYCIN <=0.12 SENSITIVE Sensitive     LEVOFLOXACIN 0.5 SENSITIVE Sensitive     VANCOMYCIN <=0.12 SENSITIVE Sensitive     * RARE STREPTOCOCCUS PYOGENES  Resp Panel by RT-PCR (Flu A&B,  Covid) Nasopharyngeal Swab     Status: None   Collection Time: 07/21/20  5:24 AM   Specimen: Nasopharyngeal Swab; Nasopharyngeal(NP) swabs in vial transport medium  Result Value Ref Range Status   SARS Coronavirus 2 by RT PCR NEGATIVE NEGATIVE Final    Comment: (NOTE) SARS-CoV-2 target nucleic acids are NOT DETECTED.  The SARS-CoV-2 RNA is generally detectable in upper respiratory specimens during the acute phase of infection. The lowest concentration of SARS-CoV-2 viral copies this assay can detect is 138 copies/mL. A negative result does not preclude SARS-Cov-2 infection and should not be used as the sole basis for treatment or other patient management decisions. A negative result may occur with  improper specimen collection/handling, submission of specimen other than nasopharyngeal swab, presence of viral mutation(s) within the areas targeted by this assay, and inadequate number of viral copies(<138 copies/mL). A negative result must be combined with clinical observations, patient history, and epidemiological information. The expected result is Negative.  Fact Sheet for Patients:  BloggerCourse.comhttps://www.fda.gov/media/152166/download  Fact Sheet for Healthcare Providers:  SeriousBroker.ithttps://www.fda.gov/media/152162/download  This test is no t yet approved or cleared by the [place] FDA and  has been authorized for detection and/or diagnosis of SARS-CoV-2 by FDA under an Emergency Use Authorization (EUA). This EUA will remain  in effect (meaning this test can be used) for the duration of the COVID-19 declaration under Section 564(b)(1) of the Act, 21 U.S.C.section 360bbb-3(b)(1), unless the authorization is terminated  or revoked sooner.       Influenza A by PCR NEGATIVE NEGATIVE Final   Influenza B by PCR NEGATIVE NEGATIVE Final    Comment: (NOTE) The Xpert Xpress SARS-CoV-2/FLU/RSV plus assay is intended as an aid in the diagnosis of influenza from Nasopharyngeal swab specimens and should  not be used as a sole basis for treatment. Nasal washings and aspirates are unacceptable for Xpert Xpress SARS-CoV-2/FLU/RSV testing.  Fact Sheet for Patients: BloggerCourse.comhttps://www.fda.gov/media/152166/download  Fact Sheet for Healthcare Providers: SeriousBroker.ithttps://www.fda.gov/media/152162/download  This test is not yet approved or cleared by the [place] FDA and has been authorized for detection and/or diagnosis of SARS-CoV-2 by FDA under an Emergency Use Authorization (EUA). This EUA will remain in effect (meaning this test can be used) for the duration of the COVID-19 declaration under Section 564(b)(1) of the Act, 21 U.S.C. section 360bbb-3(b)(1), unless the authorization is terminated or revoked.  Performed at [facility], [address] [place], [place] [address]   Aerobic/Anaerobic Culture w Gram Stain (surgical/deep wound)  Status: None   Collection Time: 07/21/20 10:37 AM   Specimen: PATH Cytology Misc. fluid; Body Fluid  Result Value Ref Range Status   Specimen Description   Final    ABSCESS LEFT ELBOW Performed at [facility], [address] [place], [place] [address]    Special Requests   Final    NONE Performed at [facility], [address], [place], [place] [address]    Gram Stain MODERATE WBC PRESENT,BOTH PMN AND MONONUCLEAR  Final   Culture   Final    No growth aerobically or anaerobically. Performed at [facility], [address] [place], [place] [address]    Report Status 07/26/2020 FINAL  Final  MRSA Next Gen by PCR, Nasal     Status: None   Collection Time: 07/21/20  5:50 PM   Specimen: Nasal Mucosa; Nasal Swab  Result Value Ref Range Status   MRSA by PCR Next Gen NOT DETECTED NOT DETECTED Final    Comment: (NOTE) The GeneXpert MRSA Assay (FDA approved for NASAL specimens only), is one component of a comprehensive MRSA colonization surveillance program. It is not intended to diagnose MRSA  infection nor to guide or monitor treatment for MRSA infections. Test performance is not FDA approved in patients less than 31 years old. Performed at [facility], [address] [place], [place] [address]          Radiology Studies: No results found.      Scheduled Meds:  Chlorhexidine Gluconate Cloth  6 each Topical Daily   diclofenac Sodium  2 g Topical QID   docusate sodium  100 mg Oral BID   lidocaine  1 patch Transdermal Q24H   lisinopril  20 mg Oral Daily   pantoprazole  40 mg Oral Daily   sodium chloride flush  10-40 mL Intracatheter Q12H   Continuous Infusions:  methocarbamol (ROBAXIN) IV     penicillin g continuous IV infusion 12 Million Units (07/29/20 1155)     LOS: 8 days    Time spent: 39 minutes spent on chart review, discussion with nursing staff, consultants, updating family and interview/physical exam; more than 50% of that time was spent in counseling and/or coordination of care.    Alvira Philips [place], DO [facility] Available via Epic secure chat 7am-7pm After these hours, please refer to coverage provider listed on [email or web address] 07/29/2020, 3:22 PM

## 2020-07-29 NOTE — Progress Notes (Signed)
OT Cancellation Note  Patient Details Name: [name] MRN: [id number] DOB: 31-Oct-1985   Cancelled Treatment:    Reason Eval/Treat Not Completed: OT screened, no needs identified, will sign off. New OT order received. Patient evaluated by OT on 7/24 and instructions and education provided. Patient has no new OT needs. Patient moving arm well WFL without significant complaints of pain.   Emer Onnen L Birtie Fellman 07/29/2020, 8:57 AM

## 2020-07-29 NOTE — Plan of Care (Signed)
Plan of care reviewed and discussed with the patient. 

## 2020-07-30 ENCOUNTER — Encounter (HOSPITAL_COMMUNITY): Payer: Self-pay | Admitting: Internal Medicine

## 2020-07-30 MED ORDER — HYDROCORTISONE 1 % EX CREA
TOPICAL_CREAM | Freq: Two times a day (BID) | CUTANEOUS | Status: DC
  Administered 2020-07-30 – 2020-08-01 (×2): 1 via TOPICAL
  Filled 2020-07-30: qty 28

## 2020-07-30 MED ORDER — AMOXICILLIN 500 MG PO CAPS: 1000.0000 mg | ORAL_CAPSULE | Freq: Three times a day (TID) | ORAL | Status: DC

## 2020-07-30 NOTE — Progress Notes (Signed)
PROGRESS NOTE    [name]  ZOX:[id number] DOB: 1985/07/07 DOA: 07/20/2020 PCP: Patient, No Pcp Per (Inactive)    Brief Narrative:  [name] is a 35 year old male with no previous past medical history of significance who [facility] on [facility]/18 with progressive left elbow pain and swelling over the last 36 hours.  Patient reports subjective feeling of fever/chills; and difficulty with range of motion of left elbow.  Denies trauma or insect bite.  No rashes.  [facility], left elbow x-ray with no acute findings.  The left elbow was aspirated by EDP and reported purulence.  Orthopedics, Dr. Shon BatonBrooks was consulted.  Blood cultures x2 were obtained and patient was started on empiric antibiotics.  TRH consulted for further evaluation and management of septic joint.   Assessment & Plan:   Principal Problem:   Septic joint of left elbow (HCC) Active Problems:   Septic arthritis (HCC)   Septic arthritis of elbow, left (HCC)   Essential hypertension   Left elbow septic arthritis with abscess  Patient presenting to the ED with progressive left elbow pain with associated fever/chills and decreased range of motion.  WBC count elevated 27,000 on admission.  Underwent aspiration of left elbow joint by EDP with purulence.  Patient underwent incision and debridement of left elbow by orthopedics, Dr. Shon BatonBrooks on 07/21/2020.  Left elbow culture with group A streptococcus.  Was seen by infectious disease and recommended 2-week IV antibiotic course following operative management with incision and drainage. --Continue IV penicillin, end date 08/03/2020 --Okay for gentle ROM to prevent stiffness; splint/dressing removed 7/25; per ortho --Ortho plans to see in office for outpatient follow-up --Difficult disposition as patient now homeless, with no safe discharge plan as patient needs IV antibiotics daily with access to refrigeration; may need to remain inpatient to  complete antibiotic course  Essential hypertension No previous history.  Patient with several major life stressors ongoing are likely contributing. BP 128/74 this morning --Continue lisinopril 20 mg p.o. 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  No rib fracture noted on x-ray.  No relief with lidocaine patch or Voltaren gel. --Norco 7.5-325mg  PO every 8 hours as needed refractory pain --Toradol 30 mg IV every 8 hours as needed --Robaxin 5 mg p.o. every 6 hours as needed muscle spasms --Continue lidocaine patch and Voltaren gel   DVT prophylaxis: SCDs Start: 07/21/20 1438 SCDs Start: 07/21/20 0740   Code Status: Full Code Family Communication: No family present at bedside this morning  Disposition Plan:  Level of care: Med-Surg Status is: Inpatient  Remains inpatient appropriate because:Unsafe d/c plan and IV treatments appropriate due to intensity of illness or inability to take PO  Dispo: The patient is from: Home              Anticipated d/c is to: Home              Patient currently is not medically stable to d/c.   Difficult to place patient No   Consultants:  Infectious disease Orthopedics, Dr. Shon BatonBrooks  Procedures:  Incision and drainage left elbow, Dr. Shon BatonBrooks 7/19  Antimicrobials:  IV penicillin 7/20>> Vancomycin 7/19 - 7/20 Ceftriaxone 7/19 - 7/20   Subjective: Patient seen examined at bedside, just finished using the restroom.  Complains of some muscle spasms to right upper back.  Discussed with patient that  he has Robaxin ordered, which she has not utilized during the hospitalization.  No other questions or concerns at this time.  Denies headache, no fever/chills/night sweats, no nausea/vomiting/diarrhea, no chest pain, no palpitations, no shortness of breath, no abdominal pain, no weakness, no fatigue, no paresthesias.  No acute events overnight per nursing  staff.  Objective: Vitals:   07/29/20 0629 07/29/20 1343 07/29/20 2129 07/30/20 0620  BP: 128/74 (!) 158/92 140/69 138/70  Pulse: (!) 57 72 71 (!) 58  Resp: 18 16 18 18   Temp: 98 F (36.7 C) 97.9 F (36.6 C) 98.7 F (37.1 C) 98.5 F (36.9 C)  TempSrc: Oral  Oral Oral  SpO2: 99% 100% 100% 99%  Weight:      Height:        Intake/Output Summary (Last 24 hours) at 07/30/2020 1045 Last data filed at 07/30/2020 0900 Gross per 24 hour  Intake 3045.9 ml  Output --  Net 3045.9 ml   Filed Weights   07/21/20 0753  Weight: 66 kg    Examination:  General exam: Appears calm and comfortable  Respiratory system: Clear to auscultation. Respiratory effort normal.  On room air Cardiovascular system: S1 & S2 heard, RRR. No JVD, murmurs, rubs, gallops or clicks. No pedal edema. Gastrointestinal system: Abdomen is nondistended, soft and nontender. No organomegaly or masses felt. Normal bowel sounds heard. Central nervous system: Alert and oriented. No focal neurological deficits. Extremities: Symmetric 5 x 5 power.  Noted left upper extremity with dressing in place.  Neurovascularly intact. Skin: No rashes, lesions or ulcers Psychiatry: Judgement and insight appear normal. Mood & affect appropriate.     Data Reviewed: I have personally reviewed following labs and imaging studies  CBC: Recent Labs  Lab 07/24/20 0255  WBC 11.1*  HGB 12.9*  HCT 38.1*  MCV 95.3  PLT 279   Basic Metabolic Panel: Recent Labs  Lab 07/25/20 0157  NA 139  K 3.7  CL 101  CO2 32  GLUCOSE 87  BUN 12  CREATININE 0.74  CALCIUM 8.9   GFR: Estimated Creatinine Clearance: 113.2 mL/min (by C-G formula based on SCr of 0.74 mg/dL). Liver Function Tests: No results for input(s): AST, ALT, ALKPHOS, BILITOT, PROT, ALBUMIN in the last 168 hours.  No results for input(s): LIPASE, AMYLASE in the last 168 hours. No results for input(s): AMMONIA in the last 168 hours. Coagulation Profile: No results for  input(s): INR, PROTIME in the last 168 hours. Cardiac Enzymes: No results for input(s): CKTOTAL, CKMB, CKMBINDEX, TROPONINI in the last 168 hours. BNP (last 3 results) No results for input(s): PROBNP in the last 8760 hours. HbA1C: No results for input(s): HGBA1C in the last 72 hours. CBG: Recent Labs  Lab 07/24/20 2321  GLUCAP 102*   Lipid Profile: No results for input(s): CHOL, HDL, LDLCALC, TRIG, CHOLHDL, LDLDIRECT in the last 72 hours. Thyroid Function Tests: No results for input(s): TSH, T4TOTAL, FREET4, T3FREE, THYROIDAB in the last 72 hours. Anemia Panel: No results for input(s): VITAMINB12, FOLATE, FERRITIN, TIBC, IRON, RETICCTPCT in the last 72 hours. Sepsis Labs: No results for input(s): PROCALCITON, LATICACIDVEN in the last 168 hours.   Recent Results (from the past 240 hour(s))  Body fluid culture w Gram Stain     Status: None   Collection Time: 07/21/20  3:59 AM   Specimen: Synovium; Body Fluid  Result Value Ref Range Status   Specimen Description   Final    SYNOVIAL LT ELBOW Performed at [facility], [address] [place], [place] [address]    Special Requests   Final    NONE Performed at [facility], [address], [place], [place] [address]    Gram Stain   Final    FEW WBC PRESENT,BOTH PMN AND MONONUCLEAR NO ORGANISMS SEEN    Culture   Final    RARE STREPTOCOCCUS PYOGENES CRITICAL RESULT CALLED TO, READ BACK BY AND VERIFIED WITH: PHARMD T GREEN [id number] AT 1033 BY CM Performed at [facility], [address], [place], [place] [address]    Report Status 07/23/2020 FINAL  Final   Organism ID, Bacteria STREPTOCOCCUS PYOGENES  Final      Susceptibility   Streptococcus pyogenes - MIC*    PENICILLIN <=0.06 SENSITIVE Sensitive     CEFTRIAXONE <=0.12 SENSITIVE Sensitive     ERYTHROMYCIN <=0.12 SENSITIVE Sensitive     LEVOFLOXACIN 0.5 SENSITIVE Sensitive     VANCOMYCIN <=0.12 SENSITIVE Sensitive     * RARE  STREPTOCOCCUS PYOGENES  Resp Panel by RT-PCR (Flu A&B, Covid) Nasopharyngeal Swab     Status: None   Collection Time: 07/21/20  5:24 AM   Specimen: Nasopharyngeal Swab; Nasopharyngeal(NP) swabs in vial transport medium  Result Value Ref Range Status   SARS Coronavirus 2 by RT PCR NEGATIVE NEGATIVE Final    Comment: (NOTE) SARS-CoV-2 target nucleic acids are NOT DETECTED.  The SARS-CoV-2 RNA is generally detectable in upper respiratory specimens during the acute phase of infection. The lowest concentration of SARS-CoV-2 viral copies this assay can detect is 138 copies/mL. A negative result does not preclude SARS-Cov-2 infection and should not be used as the sole basis for treatment or other patient management decisions. A negative result may occur with  improper specimen collection/handling, submission of specimen other than nasopharyngeal swab, presence of viral mutation(s) within the areas targeted by this assay, and inadequate number of viral copies(<138 copies/mL). A negative result must be combined with clinical observations, patient history, and epidemiological information. The expected result is Negative.  Fact Sheet for Patients:  BloggerCourse.com  Fact Sheet for Healthcare Providers:  SeriousBroker.it  This test is no t yet approved or cleared by the [place] FDA and  has been authorized for detection and/or diagnosis of SARS-CoV-2 by FDA under an Emergency Use Authorization (EUA). This EUA will remain  in effect (meaning this test can be used) for the duration of the COVID-19 declaration under Section 564(b)(1) of the Act, 21 U.S.C.section 360bbb-3(b)(1), unless the authorization is terminated  or revoked sooner.       Influenza A by PCR NEGATIVE NEGATIVE Final   Influenza B by PCR NEGATIVE NEGATIVE Final    Comment: (NOTE) The Xpert Xpress SARS-CoV-2/FLU/RSV plus assay is intended as an aid in the diagnosis of  influenza from Nasopharyngeal swab specimens and should not be used as a sole basis for treatment. Nasal washings and aspirates are unacceptable for Xpert Xpress SARS-CoV-2/FLU/RSV testing.  Fact Sheet for Patients: BloggerCourse.com  Fact Sheet for Healthcare Providers: SeriousBroker.it  This test is not yet approved or cleared by the [place] FDA and has been authorized for detection and/or diagnosis of SARS-CoV-2 by FDA under an Emergency Use Authorization (EUA). This EUA will remain in effect (meaning this test can be used) for the duration of the COVID-19 declaration under Section 564(b)(1) of the Act, 21 U.S.C. section 360bbb-3(b)(1), unless the authorization is terminated or revoked.  Performed at [facility], [address] [place], [place] [address]  Aerobic/Anaerobic Culture w Gram Stain (surgical/deep wound)     Status: None   Collection Time: 07/21/20 10:37 AM   Specimen: PATH Cytology Misc. fluid; Body Fluid  Result Value Ref Range Status   Specimen Description   Final    ABSCESS LEFT ELBOW Performed at [facility], [address], [place], [place] [address]    Special Requests   Final    NONE Performed at [facility], [address], [place], [place] [address]    Gram Stain MODERATE WBC PRESENT,BOTH PMN AND MONONUCLEAR  Final   Culture   Final    No growth aerobically or anaerobically. Performed at [facility], [address] [place], [place] [address]    Report Status 07/26/2020 FINAL  Final  MRSA Next Gen by PCR, Nasal     Status: None   Collection Time: 07/21/20  5:50 PM   Specimen: Nasal Mucosa; Nasal Swab  Result Value Ref Range Status   MRSA by PCR Next Gen NOT DETECTED NOT DETECTED Final    Comment: (NOTE) The GeneXpert MRSA Assay (FDA approved for NASAL specimens only), is one component of a comprehensive MRSA colonization  surveillance program. It is not intended to diagnose MRSA infection nor to guide or monitor treatment for MRSA infections. Test performance is not FDA approved in patients less than 68 years old. Performed at [facility], [address] [place], [place] [address]          Radiology Studies: No results found.      Scheduled Meds:  [START ON 08/04/2020] amoxicillin  1,000 mg Oral Q8H   Chlorhexidine Gluconate Cloth  6 each Topical Daily   diclofenac Sodium  2 g Topical QID   docusate sodium  100 mg Oral BID   lidocaine  1 patch Transdermal Q24H   lisinopril  20 mg Oral Daily   pantoprazole  40 mg Oral Daily   sodium chloride flush  10-40 mL Intracatheter Q12H   Continuous Infusions:  methocarbamol (ROBAXIN) IV     penicillin g continuous IV infusion 12 Million Units (07/29/20 2322)     LOS: 9 days    Time spent: 36 minutes spent on chart review, discussion with nursing staff, consultants, updating family and interview/physical exam; more than 50% of that time was spent in counseling and/or coordination of care.    Alvira Philips [place], DO [facility] Available via Epic secure chat 7am-7pm After these hours, please refer to coverage provider listed on [email or web address] 07/30/2020, 10:45 AM

## 2020-07-30 NOTE — Plan of Care (Signed)
Plan of care reviewed and discussed with the patient. 

## 2020-07-31 DIAGNOSIS — R7989 Other specified abnormal findings of blood chemistry: Secondary | ICD-10-CM | POA: Diagnosis not present

## 2020-07-31 LAB — CBC
HCT: 41.9 % (ref 39.0–52.0)
Hemoglobin: 13.9 g/dL (ref 13.0–17.0)
MCH: 32.1 pg (ref 26.0–34.0)
MCHC: 33.2 g/dL (ref 30.0–36.0)
MCV: 96.8 fL (ref 80.0–100.0)
Platelets: 333 10*3/uL (ref 150–400)
RBC: 4.33 MIL/uL (ref 4.22–5.81)
RDW: 13 % (ref 11.5–15.5)
WBC: 8.7 10*3/uL (ref 4.0–10.5)
nRBC: 0 % (ref 0.0–0.2)

## 2020-07-31 LAB — COMPREHENSIVE METABOLIC PANEL
ALT: 175 U/L — ABNORMAL HIGH (ref 0–44)
AST: 104 U/L — ABNORMAL HIGH (ref 15–41)
Albumin: 3.7 g/dL (ref 3.5–5.0)
Alkaline Phosphatase: 85 U/L (ref 38–126)
Anion gap: 10 (ref 5–15)
BUN: 17 mg/dL (ref 6–20)
CO2: 28 mmol/L (ref 22–32)
Calcium: 9.4 mg/dL (ref 8.9–10.3)
Chloride: 101 mmol/L (ref 98–111)
Creatinine, Ser: 0.77 mg/dL (ref 0.61–1.24)
GFR, Estimated: >60 mL/min (ref >60)
Glucose, Bld: 91 mg/dL (ref 70–99)
Potassium: 4.2 mmol/L (ref 3.5–5.1)
Sodium: 139 mmol/L (ref 135–145)
Total Bilirubin: 0.5 mg/dL (ref 0.3–1.2)
Total Protein: 7.4 g/dL (ref 6.5–8.1)

## 2020-07-31 NOTE — Progress Notes (Signed)
Came in to room to assess PICC for occlusion as consulted by RN. Pt. Was up on a chair , sitting up right and on his cell phone, IV infusing well, no occlusion noted upon assessment . PICC line flushed well with good blood return. Pt. Stated that he wants the PICC to be out and would rather take PO meds, stated that he is not able to take a bath with it and it takes longer time for staff to come and turn off the IV pump once its beeping. To notify RN.

## 2020-07-31 NOTE — Progress Notes (Signed)
PROGRESS NOTE    [name]  QZR:[id number] DOB: 07/17/1985 DOA: 07/20/2020 PCP: Patient, No Pcp Per (Inactive)    Brief Narrative:  [name] is a 35 year old male with no previous past medical history of significance who presented to [facility] ED on 7/18 with progressive left elbow pain and swelling over the last 36 hours.  Patient reports subjective feeling of fever/chills; and difficulty with range of motion of left elbow.  Denies trauma or insect bite.  No rashes.  In the ED, the [facility] count elevated 27,000, left elbow x-ray with no acute findings.  The left elbow was aspirated by EDP and reported purulence.  Orthopedics, Dr. Shon Baton was consulted.  Blood cultures x2 were obtained and patient was started on empiric antibiotics.  TRH consulted for further evaluation and management of septic joint.   Assessment & Plan:   Principal Problem:   Septic joint of left elbow (HCC) Active Problems:   Septic arthritis (HCC)   Septic arthritis of elbow, left (HCC)   Essential hypertension   Left elbow septic arthritis with abscess  Patient presenting to the ED with progressive left elbow pain with associated fever/chills and decreased range of motion.  WBC count elevated 27,000 on admission.  Underwent aspiration of left elbow joint by EDP with purulence.  Patient underwent incision and debridement of left elbow by orthopedics, Dr. Shon Baton on 07/21/2020.  Left elbow culture with group A streptococcus.  Was seen by infectious disease and recommended 2-week IV antibiotic course following operative management with incision and drainage. --Continue IV penicillin, end date 08/03/2020 --Okay for gentle ROM to prevent stiffness; splint/dressing removed 7/25; per ortho --Ortho plans to see in office for outpatient follow-up --Difficult disposition as patient now homeless, with no safe discharge plan as patient needs IV antibiotics daily with access to refrigeration; may need to remain inpatient to  complete antibiotic course  Elevated LFTs Routine follow-up labs this morning show elevated LFTs.  Unclear etiology, patient with no abdominal complaints.  Unlikely related to IV penicillin. --AST 28>104 --ALT 27>175 --Tbili 1.2>0.5 --Repeat LFTs in a.m., if upward trend would recommend obtaining right upper quadrant ultrasound  Essential hypertension No previous history.  Patient with several major life stressors ongoing are likely contributing. BP 129/71 this morning --Continue lisinopril 20 mg p.o. 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  No rib fracture noted on x-ray.  No relief with lidocaine patch or Voltaren gel. --Norco 7.5-325mg  PO every 8 hours as needed refractory pain --Toradol 30 mg IV every 8 hours as needed --Robaxin 5 mg p.o. every 6 hours as needed muscle spasms --Continue lidocaine patch and Voltaren gel   DVT prophylaxis: SCDs Start: 07/21/20 1438 SCDs Start: 07/21/20 0740   Code Status: Full Code Family Communication: No family present at bedside this morning  Disposition Plan:  Level of care: Med-Surg Status is: Inpatient  Remains inpatient appropriate because:Unsafe d/c plan and IV treatments appropriate due to intensity of illness or inability to take PO  Dispo: The patient is from: Home              Anticipated d/c is to: Home              Patient currently is not medically stable to d/c.   Difficult to place patient No   Consultants:  Infectious disease Orthopedics, Dr. Shon Baton  Procedures:  Incision and drainage left elbow,  Dr. Shon Baton 7/19  Antimicrobials:  IV penicillin 7/20>> Vancomycin 7/19 - 7/20 Ceftriaxone 7/19 - 7/20   Subjective: Patient seen examined at bedside, sleeping but easily arousable.  She pulled up over his head.  No complaints this morning.  Noted increased LFTs on repeat labs this morning.  Denies headache, no  fever/chills/night sweats, no nausea/vomiting/diarrhea, no chest pain, no palpitations, no shortness of breath, no abdominal pain, no weakness, no fatigue, no paresthesias.  No acute events overnight per nursing staff.  Objective: Vitals:   07/29/20 2129 07/30/20 0620 07/30/20 1332 07/30/20 2217  BP: 140/69 138/70 128/70 129/71  Pulse: 71 (!) 58 77 73  Resp: 18 18 17 16   Temp: 98.7 F (37.1 C) 98.5 F (36.9 C) 98 F (36.7 C) 98.5 F (36.9 C)  TempSrc: Oral Oral  Oral  SpO2: 100% 99% 100% 99%  Weight:      Height:        Intake/Output Summary (Last 24 hours) at 07/31/2020 1028 Last data filed at 07/31/2020 0951 Gross per 24 hour  Intake 1963.35 ml  Output --  Net 1963.35 ml   Filed Weights   07/21/20 0753  Weight: 66 kg    Examination:  General exam: Appears calm and comfortable  Respiratory system: Clear to auscultation. Respiratory effort normal.  On room air Cardiovascular system: S1 & S2 heard, RRR. No JVD, murmurs, rubs, gallops or clicks. No pedal edema. Gastrointestinal system: Abdomen is nondistended, soft and nontender. No organomegaly or masses felt. Normal bowel sounds heard. Central nervous system: Alert and oriented. No focal neurological deficits. Extremities: Symmetric 5 x 5 power.  Noted left upper extremity with dressing in place.  Neurovascularly intact. Skin: No rashes, lesions or ulcers Psychiatry: Judgement and insight appear normal. Mood & affect appropriate.     Data Reviewed: I have personally reviewed following labs and imaging studies  CBC: Recent Labs  Lab 07/31/20 0440  WBC 8.7  HGB 13.9  HCT 41.9  MCV 96.8  PLT 333   Basic Metabolic Panel: Recent Labs  Lab 07/25/20 0157 07/31/20 0440  NA 139 139  K 3.7 4.2  CL 101 101  CO2 32 28  GLUCOSE 87 91  BUN 12 17  CREATININE 0.74 0.77  CALCIUM 8.9 9.4   GFR: Estimated Creatinine Clearance: 113.2 mL/min (by C-G formula based on SCr of 0.77 mg/dL). Liver Function Tests: Recent  Labs  Lab 07/31/20 0440  AST 104*  ALT 175*  ALKPHOS 85  BILITOT 0.5  PROT 7.4  ALBUMIN 3.7    No results for input(s): LIPASE, AMYLASE in the last 168 hours. No results for input(s): AMMONIA in the last 168 hours. Coagulation Profile: No results for input(s): INR, PROTIME in the last 168 hours. Cardiac Enzymes: No results for input(s): CKTOTAL, CKMB, CKMBINDEX, TROPONINI in the last 168 hours. BNP (last 3 results) No results for input(s): PROBNP in the last 8760 hours. HbA1C: No results for input(s): HGBA1C in the last 72 hours. CBG: Recent Labs  Lab 07/24/20 2321  GLUCAP 102*   Lipid Profile: No results for input(s): CHOL, HDL, LDLCALC, TRIG, CHOLHDL, LDLDIRECT in the last 72 hours. Thyroid Function Tests: No results for input(s): TSH, T4TOTAL, FREET4, T3FREE, THYROIDAB in the last 72 hours. Anemia Panel: No results for input(s): VITAMINB12, FOLATE, FERRITIN, TIBC, IRON, RETICCTPCT in the last 72 hours. Sepsis Labs: No results for input(s): PROCALCITON, LATICACIDVEN in the last 168 hours.   Recent Results (from the past 240 hour(s))  Aerobic/Anaerobic Culture w Gram  Stain (surgical/deep wound)     Status: None   Collection Time: 07/21/20 10:37 AM   Specimen: PATH Cytology Misc. fluid; Body Fluid  Result Value Ref Range Status   Specimen Description   Final    ABSCESS LEFT ELBOW Performed at [facility], [address] [place], [place] [address]    Special Requests   Final    NONE Performed at [facility], [address] [place], [place] [address]    Gram Stain MODERATE WBC PRESENT,BOTH PMN AND MONONUCLEAR  Final   Culture   Final    No growth aerobically or anaerobically. Performed at [facility], [address] [place], [place] [address]    Report Status 07/26/2020 FINAL  Final  MRSA Next Gen by PCR, Nasal     Status: None   Collection Time: 07/21/20  5:50 PM   Specimen: Nasal Mucosa; Nasal Swab  Result  Value Ref Range Status   MRSA by PCR Next Gen NOT DETECTED NOT DETECTED Final    Comment: (NOTE) The GeneXpert MRSA Assay (FDA approved for NASAL specimens only), is one component of a comprehensive MRSA colonization surveillance program. It is not intended to diagnose MRSA infection nor to guide or monitor treatment for MRSA infections. Test performance is not FDA approved in patients less than 75 years old. Performed at [facility], [address], [place], [place] [address]          Radiology Studies: No results found.      Scheduled Meds:  [START ON 08/04/2020] amoxicillin  1,000 mg Oral Q8H   Chlorhexidine Gluconate Cloth  6 each Topical Daily   diclofenac Sodium  2 g Topical QID   docusate sodium  100 mg Oral BID   hydrocortisone cream   Topical BID   lidocaine  1 patch Transdermal Q24H   lisinopril  20 mg Oral Daily   pantoprazole  40 mg Oral Daily   sodium chloride flush  10-40 mL Intracatheter Q12H   Continuous Infusions:  methocarbamol (ROBAXIN) IV     penicillin g continuous IV infusion 12 Million Units (07/31/20 0611)     LOS: 10 days    Time spent: 36 minutes spent on chart review, discussion with nursing staff, consultants, updating family and interview/physical exam; more than 50% of that time was spent in counseling and/or coordination of care.    Alvira Philips [place], DO [facility] Available via Epic secure chat 7am-7pm After these hours, please refer to coverage provider listed on [email or web address] 07/31/2020, 10:28 AM

## 2020-07-31 NOTE — Plan of Care (Signed)
  Problem: Education: Goal: Knowledge of General Education information will improve Description: Including pain rating scale, medication(s)/side effects and non-pharmacologic comfort measures Outcome: Progressing   Problem: Clinical Measurements: Goal: Will remain free from infection Outcome: Progressing   

## 2020-08-01 DIAGNOSIS — R0789 Other chest pain: Secondary | ICD-10-CM

## 2020-08-01 DIAGNOSIS — R7989 Other specified abnormal findings of blood chemistry: Secondary | ICD-10-CM

## 2020-08-01 LAB — HEPATIC FUNCTION PANEL
ALT: 165 U/L — ABNORMAL HIGH (ref 0–44)
AST: 88 U/L — ABNORMAL HIGH (ref 15–41)
Albumin: 3.5 g/dL (ref 3.5–5.0)
Alkaline Phosphatase: 77 U/L (ref 38–126)
Bilirubin, Direct: 0.2 mg/dL (ref 0.0–0.2)
Indirect Bilirubin: 0.7 mg/dL (ref 0.3–0.9)
Total Bilirubin: 0.9 mg/dL (ref 0.3–1.2)
Total Protein: 7.1 g/dL (ref 6.5–8.1)

## 2020-08-01 NOTE — Progress Notes (Signed)
PROGRESS NOTE  [name] BWL:[id number] DOB: 02/20/1985   PCP: Patient, No Pcp Per (Inactive)  Patient is from: Homeless  DOA: 07/20/2020 LOS: 11  Chief complaints:  Chief Complaint  Patient presents with   Elbow Pain   Flank Pain     Brief Narrative / Interim history: 35 year old M with no significant PMH presented with acute progressive left elbow pain and swelling, fever, chills and decreased ROM, and admitted for septic joint of left elbow.  He underwent I&D by Dr. Shon Baton on 7/19.  Culture grew group A streptococcus.  ID recommended 2 weeks of IV penicillin with end date of 08/03/2020.  Subjective: Seen and examined earlier this morning.  No major events overnight or this morning.  Intermittent right posterior chest wall pain that improved with Tylenol.  Denies dyspnea, cough, GI or UTI symptoms.  Objective: Vitals:   07/30/20 2217 07/31/20 1359 07/31/20 2108 08/01/20 0615  BP: 129/71 132/78 125/73 126/74  Pulse: 73 85 71 60  Resp: 16  18 18   Temp: 98.5 F (36.9 C) 97.7 F (36.5 C)  97.9 F (36.6 C)  TempSrc: Oral Oral  Oral  SpO2: 99% 100% 99% 99%  Weight:      Height:        Intake/Output Summary (Last 24 hours) at 08/01/2020 1322 Last data filed at 08/01/2020 0600 Gross per 24 hour  Intake 1572.76 ml  Output --  Net 1572.76 ml   Filed Weights   07/21/20 0753  Weight: 66 kg    Examination:  GENERAL: No apparent distress.  Nontoxic. HEENT: MMM.  Vision and hearing grossly intact.  NECK: Supple.  No apparent JVD.  RESP:  No IWOB.  Fair aeration bilaterally. CVS:  RRR. Heart sounds normal.  ABD/GI/GU: BS+. Abd soft, NTND.  MSK/EXT:  Moves extremities. No apparent deformity. No edema.  Stitches over left elbow. SKIN: no apparent skin lesion or wound NEURO: Awake, alert and oriented appropriately.  No apparent focal neuro deficit. PSYCH: Calm. Normal affect.   Procedures:  7/19-incision and debridement of left elbow by Dr. 07/23/20  Microbiology  summarized: COVID-19 and influenza PCR nonreactive. Synovial fluid culture from left elbow with Streptococcus pyogenes   Assessment & Plan: Septic arthritis of left elbow with abscess s/p I&D on 7/19.  Culture data as above.  Improving. -Continue IV penicillin through 08/03/2020 followed by p.o. amoxicillin per ID -We will clarify duration on p.o. amoxicillin and outpatient follow-up with ID -Okay for gentle ROM to prevent stiffness per Ortho. -Outpatient follow-up for stitch removal in place. -Patient is homeless and has no access to refrigerator.  Remains in-house to complete IV antibiotics    Elevated LFTs: Unclear etiology but improving. -Recheck in the morning   Essential hypertension-no previous history.  Normotensive. -Continue lisinopril 20 mg p.o. 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  No rib fracture noted on x-ray.  -Pain fairly controlled with as needed Tylenol.  He did not require Norco or Robaxin in the last 24 to 48 hours. -Continue topical Voltaren gel and Lidoderm as well.  Body mass index is 24.21 kg/m.         DVT prophylaxis:  SCDs Start: 07/21/20 1438 SCDs Start: 07/21/20 0740  Code Status: Full code Family Communication: Patient and/or RN. Available if any question.  Level of care: Med-Surg Status is: Inpatient  Remains inpatient appropriate because:IV treatments appropriate due to intensity of  illness or inability to take PO and Inpatient level of care appropriate due to severity of illness  Dispo: The patient is from:  Homeless              Anticipated d/c is to:  To be determined              Patient currently is not medically stable to d/c.   Difficult to place patient No       Consultants:  Orthopedic surgery Infectious disease   Sch Meds:  Scheduled Meds:  [START ON 08/04/2020] amoxicillin  1,000 mg Oral Q8H   Chlorhexidine Gluconate  Cloth  6 each Topical Daily   diclofenac Sodium  2 g Topical QID   docusate sodium  100 mg Oral BID   hydrocortisone cream   Topical BID   lidocaine  1 patch Transdermal Q24H   lisinopril  20 mg Oral Daily   pantoprazole  40 mg Oral Daily   sodium chloride flush  10-40 mL Intracatheter Q12H   Continuous Infusions:  penicillin g continuous IV infusion 12 Million Units (08/01/20 0520)   PRN Meds:.[DISCONTINUED] acetaminophen **OR** acetaminophen, acetaminophen, hydrALAZINE, methocarbamol **OR** [DISCONTINUED] methocarbamol (ROBAXIN) IV, metoCLOPramide **OR** metoCLOPramide (REGLAN) injection, ondansetron **OR** ondansetron (ZOFRAN) IV, sodium chloride flush  Antimicrobials: Anti-infectives (From admission, onward)    Start     Dose/Rate Route Frequency Ordered Stop   08/04/20 0600  amoxicillin (AMOXIL) capsule 1,000 mg        1,000 mg Oral Every 8 hours 07/30/20 0946     07/22/20 1900  penicillin G potassium 12 Million Units in dextrose 5 % 500 mL continuous infusion        12 Million Units 41.7 mL/hr over 12 Hours Intravenous Every 12 hours 07/22/20 1110 08/04/20 0559   07/22/20 0600  cefTRIAXone (ROCEPHIN) 2 g in sodium chloride 0.9 % 100 mL IVPB  Status:  Discontinued        2 g 200 mL/hr over 30 Minutes Intravenous Every 24 hours 07/21/20 0741 07/22/20 1110   07/21/20 1800  vancomycin (VANCOCIN) IVPB 1000 mg/200 mL premix  Status:  Discontinued        1,000 mg 200 mL/hr over 60 Minutes Intravenous Every 12 hours 07/21/20 0808 07/22/20 1110   07/21/20 0415  vancomycin (VANCOREADY) IVPB 1250 mg/250 mL        1,250 mg 166.7 mL/hr over 90 Minutes Intravenous STAT 07/21/20 0401 07/21/20 0650   07/21/20 0400  cefTRIAXone (ROCEPHIN) 2 g in sodium chloride 0.9 % 100 mL IVPB        2 g 200 mL/hr over 30 Minutes Intravenous  Once 07/21/20 0358 07/21/20 0513        I have personally reviewed the following labs and images: CBC: Recent Labs  Lab 07/31/20 0440  WBC 8.7  HGB 13.9   HCT 41.9  MCV 96.8  PLT 333   BMP &GFR Recent Labs  Lab 07/31/20 0440  NA 139  K 4.2  CL 101  CO2 28  GLUCOSE 91  BUN 17  CREATININE 0.77  CALCIUM 9.4   Estimated Creatinine Clearance: 113.2 mL/min (by C-G formula based on SCr of 0.77 mg/dL). Liver & Pancreas: Recent Labs  Lab 07/31/20 0440 08/01/20 0333  AST 104* 88*  ALT 175* 165*  ALKPHOS 85 77  BILITOT 0.5 0.9  PROT 7.4 7.1  ALBUMIN 3.7 3.5   No results for input(s): LIPASE, AMYLASE in the last 168 hours. No results for input(s): AMMONIA in the last  168 hours. Diabetic: No results for input(s): HGBA1C in the last 72 hours. No results for input(s): GLUCAP in the last 168 hours. Cardiac Enzymes: No results for input(s): CKTOTAL, CKMB, CKMBINDEX, TROPONINI in the last 168 hours. No results for input(s): PROBNP in the last 8760 hours. Coagulation Profile: No results for input(s): INR, PROTIME in the last 168 hours. Thyroid Function Tests: No results for input(s): TSH, T4TOTAL, FREET4, T3FREE, THYROIDAB in the last 72 hours. Lipid Profile: No results for input(s): CHOL, HDL, LDLCALC, TRIG, CHOLHDL, LDLDIRECT in the last 72 hours. Anemia Panel: No results for input(s): VITAMINB12, FOLATE, FERRITIN, TIBC, IRON, RETICCTPCT in the last 72 hours. Urine analysis:    Component Value Date/Time   COLORURINE YELLOW 07/21/2020 0220   APPEARANCEUR HAZY (A) 07/21/2020 0220   LABSPEC 1.028 07/21/2020 0220   PHURINE 5.0 07/21/2020 0220   GLUCOSEU NEGATIVE 07/21/2020 0220   HGBUR SMALL (A) 07/21/2020 0220   BILIRUBINUR NEGATIVE 07/21/2020 0220   BILIRUBINUR negative 10/07/2019 1254   KETONESUR 20 (A) 07/21/2020 0220   PROTEINUR 100 (A) 07/21/2020 0220   UROBILINOGEN 0.2 10/07/2019 1254   NITRITE NEGATIVE 07/21/2020 0220   LEUKOCYTESUR NEGATIVE 07/21/2020 0220   Sepsis Labs: Invalid input(s): PROCALCITONIN, LACTICIDVEN  Microbiology: No results found for this or any previous visit (from the past 240  hour(s)).  Radiology Studies: No results found.     [name] T. Rontavious Albright [facility]  If 7PM-7AM, please contact night-coverage [email or web address] 08/01/2020, 1:22 PM

## 2020-08-02 LAB — HEPATIC FUNCTION PANEL
ALT: 171 U/L — ABNORMAL HIGH (ref 0–44)
AST: 87 U/L — ABNORMAL HIGH (ref 15–41)
Albumin: 3.6 g/dL (ref 3.5–5.0)
Alkaline Phosphatase: 84 U/L (ref 38–126)
Bilirubin, Direct: 0.3 mg/dL — ABNORMAL HIGH (ref 0.0–0.2)
Indirect Bilirubin: 0.6 mg/dL (ref 0.3–0.9)
Total Bilirubin: 0.9 mg/dL (ref 0.3–1.2)
Total Protein: 7.3 g/dL (ref 6.5–8.1)

## 2020-08-02 NOTE — Progress Notes (Signed)
PROGRESS NOTE  [name] IPJ:[id number] DOB: July 16, 1985   PCP: Patient, No Pcp Per (Inactive)  Patient is from: Homeless  DOA: 07/20/2020 LOS: 12  Chief complaints:  Chief Complaint  Patient presents with   Elbow Pain   Flank Pain     Brief Narrative / Interim history: 35 year old M with no significant PMH presented with acute progressive left elbow pain and swelling, fever, chills and decreased ROM, and admitted for septic joint of left elbow.  He underwent I&D by Dr. Shon Baton on 7/19.  Culture grew group A streptococcus.  ID recommended 2 weeks of IV penicillin with end date of 08/03/2020.  Subjective: Seen and examined earlier this afternoon.  He seems to be very upset.  He says "it is personal".  He is also concerned about weakness in his left hand.  He says his left hand strength is very important to go back to work.  He is asking if there is any kind of exercise he could do to help.  He also complains about not getting consistent information about his care and treatment plan.  Objective: Vitals:   08/01/20 1359 08/01/20 2112 08/02/20 0619 08/02/20 1442  BP: 135/69 135/68 105/65 125/76  Pulse: 87 76 61 92  Resp: 18 18 14 12   Temp: 98.5 F (36.9 C) 98.4 F (36.9 C) 98.5 F (36.9 C) 98.6 F (37 C)  TempSrc: Oral Oral  Oral  SpO2: 100% 98% 99% 99%  Weight:      Height:        Intake/Output Summary (Last 24 hours) at 08/02/2020 1454 Last data filed at 08/02/2020 1000 Gross per 24 hour  Intake 2237.2 ml  Output --  Net 2237.2 ml   Filed Weights   07/21/20 0753  Weight: 66 kg    Examination:  GENERAL: No apparent distress.  Nontoxic. HEENT: MMM.  Vision and hearing grossly intact.  NECK: Supple.  No apparent JVD.  RESP: On RA.  No IWOB.  Fair aeration bilaterally. CVS:  RRR. Heart sounds normal.  ABD/GI/GU: BS+. Abd soft, NTND.  MSK/EXT:  Moves extremities.  Stating over left elbow. SKIN: no apparent skin lesion or wound NEURO: Awake and alert. Oriented  appropriately.  No apparent focal neuro deficit. PSYCH: Somewhat upset and angry.  Procedures:  7/19-incision and debridement of left elbow by Dr. 07/23/20  Microbiology summarized: COVID-19 and influenza PCR nonreactive. Synovial fluid culture from left elbow with Streptococcus pyogenes   Assessment & Plan: Septic arthritis of left elbow with abscess s/p I&D on 7/19.  Culture data as above.  Improving. -Continue IV penicillin through 08/03/2020 followed by p.o. amoxicillin per ID -We will clarify duration on p.o. amoxicillin and outpatient follow-up with ID -Okay for gentle ROM to prevent stiffness per Ortho.  -OT consulted for left hand/arm exercise recommendations. -Outpatient follow-up for stitch removal in place. -Remains in-house to complete IV antibiotics    Elevated LFTs: Unclear etiology but stable.  HIV nonreactive. -Check acute hepatitis panel   Essential hypertension-no previous history.  Normotensive. -Continue lisinopril 20 mg p.o. 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  No rib fracture noted on x-ray.  -Pain fairly controlled with as needed Tylenol.  -Continue topical Voltaren gel and Lidoderm as well.  Positive low titer RPR: Previously treated for syphilis  Body mass index is 24.21 kg/m.         DVT prophylaxis:  SCDs Start:  07/21/20 1438 SCDs Start: 07/21/20 0740  Code Status: Full code Family Communication: Patient and/or RN. Available if any question.  Level of care: Med-Surg Status is: Inpatient  Remains inpatient appropriate because:IV treatments appropriate due to intensity of illness or inability to take PO and Inpatient level of care appropriate due to severity of illness  Dispo: The patient is from:  Homeless              Anticipated d/c is to:  To be determined              Patient currently is not medically stable to d/c.   Difficult to place  patient No       Consultants:  Orthopedic surgery Infectious disease   Sch Meds:  Scheduled Meds:  [START ON 08/04/2020] amoxicillin  1,000 mg Oral Q8H   Chlorhexidine Gluconate Cloth  6 each Topical Daily   diclofenac Sodium  2 g Topical QID   docusate sodium  100 mg Oral BID   hydrocortisone cream   Topical BID   lidocaine  1 patch Transdermal Q24H   lisinopril  20 mg Oral Daily   pantoprazole  40 mg Oral Daily   sodium chloride flush  10-40 mL Intracatheter Q12H   Continuous Infusions:  penicillin g continuous IV infusion 12 Million Units (08/02/20 0539)   PRN Meds:.[DISCONTINUED] acetaminophen **OR** acetaminophen, acetaminophen, hydrALAZINE, methocarbamol **OR** [DISCONTINUED] methocarbamol (ROBAXIN) IV, metoCLOPramide **OR** metoCLOPramide (REGLAN) injection, ondansetron **OR** ondansetron (ZOFRAN) IV, sodium chloride flush  Antimicrobials: Anti-infectives (From admission, onward)    Start     Dose/Rate Route Frequency Ordered Stop   08/04/20 0600  amoxicillin (AMOXIL) capsule 1,000 mg        1,000 mg Oral Every 8 hours 07/30/20 0946     07/22/20 1900  penicillin G potassium 12 Million Units in dextrose 5 % 500 mL continuous infusion        12 Million Units 41.7 mL/hr over 12 Hours Intravenous Every 12 hours 07/22/20 1110 08/04/20 0559   07/22/20 0600  cefTRIAXone (ROCEPHIN) 2 g in sodium chloride 0.9 % 100 mL IVPB  Status:  Discontinued        2 g 200 mL/hr over 30 Minutes Intravenous Every 24 hours 07/21/20 0741 07/22/20 1110   07/21/20 1800  vancomycin (VANCOCIN) IVPB 1000 mg/200 mL premix  Status:  Discontinued        1,000 mg 200 mL/hr over 60 Minutes Intravenous Every 12 hours 07/21/20 0808 07/22/20 1110   07/21/20 0415  vancomycin (VANCOREADY) IVPB 1250 mg/250 mL        1,250 mg 166.7 mL/hr over 90 Minutes Intravenous STAT 07/21/20 0401 07/21/20 0650   07/21/20 0400  cefTRIAXone (ROCEPHIN) 2 g in sodium chloride 0.9 % 100 mL IVPB        2 g 200 mL/hr over  30 Minutes Intravenous  Once 07/21/20 0358 07/21/20 0513        I have personally reviewed the following labs and images: CBC: Recent Labs  Lab 07/31/20 0440  WBC 8.7  HGB 13.9  HCT 41.9  MCV 96.8  PLT 333   BMP &GFR Recent Labs  Lab 07/31/20 0440  NA 139  K 4.2  CL 101  CO2 28  GLUCOSE 91  BUN 17  CREATININE 0.77  CALCIUM 9.4   Estimated Creatinine Clearance: 113.2 mL/min (by C-G formula based on SCr of 0.77 mg/dL). Liver & Pancreas: Recent Labs  Lab 07/31/20 0440 08/01/20 0333 08/02/20 0310  AST 104* 88*  87*  ALT 175* 165* 171*  ALKPHOS 85 77 84  BILITOT 0.5 0.9 0.9  PROT 7.4 7.1 7.3  ALBUMIN 3.7 3.5 3.6   No results for input(s): LIPASE, AMYLASE in the last 168 hours. No results for input(s): AMMONIA in the last 168 hours. Diabetic: No results for input(s): HGBA1C in the last 72 hours. No results for input(s): GLUCAP in the last 168 hours. Cardiac Enzymes: No results for input(s): CKTOTAL, CKMB, CKMBINDEX, TROPONINI in the last 168 hours. No results for input(s): PROBNP in the last 8760 hours. Coagulation Profile: No results for input(s): INR, PROTIME in the last 168 hours. Thyroid Function Tests: No results for input(s): TSH, T4TOTAL, FREET4, T3FREE, THYROIDAB in the last 72 hours. Lipid Profile: No results for input(s): CHOL, HDL, LDLCALC, TRIG, CHOLHDL, LDLDIRECT in the last 72 hours. Anemia Panel: No results for input(s): VITAMINB12, FOLATE, FERRITIN, TIBC, IRON, RETICCTPCT in the last 72 hours. Urine analysis:    Component Value Date/Time   COLORURINE YELLOW 07/21/2020 0220   APPEARANCEUR HAZY (A) 07/21/2020 0220   LABSPEC 1.028 07/21/2020 0220   PHURINE 5.0 07/21/2020 0220   GLUCOSEU NEGATIVE 07/21/2020 0220   HGBUR SMALL (A) 07/21/2020 0220   BILIRUBINUR NEGATIVE 07/21/2020 0220   BILIRUBINUR negative 10/07/2019 1254   KETONESUR 20 (A) 07/21/2020 0220   PROTEINUR 100 (A) 07/21/2020 0220   UROBILINOGEN 0.2 10/07/2019 1254   NITRITE  NEGATIVE 07/21/2020 0220   LEUKOCYTESUR NEGATIVE 07/21/2020 0220   Sepsis Labs: Invalid input(s): PROCALCITONIN, LACTICIDVEN  Microbiology: No results found for this or any previous visit (from the past 240 hour(s)).  Radiology Studies: No results found.     Marcus Marone T. Keniya Schlotterbeck [facility]  If 7PM-7AM, please contact night-coverage [email or web address] 08/02/2020, 2:54 PM

## 2020-08-03 ENCOUNTER — Inpatient Hospital Stay: Payer: Self-pay | Admitting: Internal Medicine

## 2020-08-03 ENCOUNTER — Other Ambulatory Visit (HOSPITAL_COMMUNITY): Payer: Self-pay

## 2020-08-03 LAB — HEPATITIS PANEL, ACUTE
HCV Ab: NONREACTIVE
Hep A IgM: NONREACTIVE
Hep B C IgM: NONREACTIVE
Hepatitis B Surface Ag: NONREACTIVE

## 2020-08-03 MED ORDER — ACETAMINOPHEN 325 MG PO TABS: 325.0000 mg | ORAL_TABLET | Freq: Four times a day (QID) | ORAL | Status: AC | PRN

## 2020-08-03 MED ORDER — AMLODIPINE BESYLATE 5 MG PO TABS
5.0000 mg | ORAL_TABLET | Freq: Every day | ORAL | 1 refills | Status: AC
  Filled 2020-08-03: qty 90, 90d supply, fill #0

## 2020-08-03 MED ORDER — AMOXICILLIN 500 MG PO CAPS
500.0000 mg | ORAL_CAPSULE | Freq: Three times a day (TID) | ORAL | 0 refills | Status: AC
  Filled 2020-08-03: qty 42, 14d supply, fill #0

## 2020-08-03 NOTE — Progress Notes (Signed)
Reviewed written d/c instructions w pt and all questions answered. Pt verbalized understanding. Gave pt his take-home meds left w me from [facility] outpt pharmacy (Norvasc and Amoxicillin), reviewed w pt. Pt d/c ambulatory w all belongings in stable condition.

## 2020-08-03 NOTE — Progress Notes (Signed)
Subjective: 13 Days Post-Op Procedure(s) (LRB): INCISION AND DRAINAGE ABSCESS (Left) Patient reports pain as mild.    Objective: Vital signs in last 24 hours: Temp:  [97.8 F (36.6 C)-98.6 F (37 C)] 97.8 F (36.6 C) (07/31 2124) Pulse Rate:  [67-92] 67 (08/01 0457) Resp:  [12-16] 16 (08/01 0457) BP: (113-125)/(60-76) 113/66 (08/01 0457) SpO2:  [99 %-100 %] 100 % (08/01 0457)  Intake/Output from previous day: 07/31 0701 - 08/01 0700 In: 2154 [P.O.:1320; IV Piggyback:834] Out: -  Intake/Output this shift: Total I/O In: 960 [P.O.:960] Out: -   No results for input(s): HGB in the last 72 hours. No results for input(s): WBC, RBC, HCT, PLT in the last 72 hours. No results for input(s): NA, K, CL, CO2, BUN, CREATININE, GLUCOSE, CALCIUM in the last 72 hours. No results for input(s): LABPT, INR in the last 72 hours.  AAOx3 Incision C/D/I, sutures removed today without any issues. Distal sensation intact. Good wrist flexion and extension. Palpable distal pulses.    Assessment/Plan: 13 Days Post-Op Procedure(s) (LRB): INCISION AND DRAINAGE ABSCESS (Left)  Patient being discharged today. We will see him for follow-up as an out patient in 2 weeks   Rhodia Albright 08/03/2020, 1:03 PM

## 2020-08-03 NOTE — Discharge Summary (Signed)
Physician Discharge Summary  BORA BRONER JJK:[id number] DOB: 07/17/1985 DOA: 07/20/2020  PCP: Patient, No Pcp Per (Inactive)  Admit date: 07/20/2020 Discharge date: 08/03/2020  Admitted From: Home Disposition: Home  Recommendations for Outpatient Follow-up:  Follow ups as below. Please obtain CBC/BMP/Mag at follow up Please follow up on the following pending results: Acute hepatitis panel  Home Health: Not indicated Equipment/Devices: Not indicated  Discharge Condition: Stable CODE STATUS: Full code   Follow-up Information     Venita Lick, MD. Schedule an appointment as soon as possible for a visit on 08/06/2020.   Specialty: Orthopedic Surgery Why: For wound re-check Removal of splint and wound evaluation Contact information: [address] [place] [address] [phone]                 Hospital Course: 35 year old M with no significant PMH presented with acute progressive left elbow pain and swelling, fever, chills and decreased ROM, and admitted for septic joint of left elbow.  He underwent I&D by Dr. Shon Baton on 7/19.  Culture grew group A streptococcus.  Received IV penicillin for 2 weeks and discharged on p.o. amoxicillin until follow-up with ID on 08/12/2020.  Patient to follow-up with orthopedic surgery on 08/06/2020.  Advised to limit weightbearing on left arm except for gentle range of motion.  Provided with letter for work.   Patient has mild LFT elevation with unclear etiology.  Acute hepatitis panel pending.  See individual problem list below for more on hospital course.  Discharge Diagnoses:  Septic arthritis of left elbow with abscess s/p I&D on 7/19.  Culture data as above.  Improving. -Continue IV penicillin through 08/03/2020 followed by p.o. amoxicillin 500 mg 3 times daily until follow-up with ID -Okay for gentle ROM to prevent stiffness per Ortho.  -Outpatient follow-up for stitch removal in place.   Elevated LFTs: Unclear  etiology but stable.  HIV nonreactive. -Acute hepatitis panel pending.   Essential hypertension-no previous history.  Normotensive. -Discharged on amlodipine 5 mg daily.   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  No rib fracture noted on x-ray.  Improved.   Positive low titer RPR: Previously treated for syphilis   Body mass index is 24.21 kg/m.            Discharge Exam: Vitals:   08/02/20 2124 08/03/20 0457  BP: 115/60 113/66  Pulse: 84 67  Resp: 16 16  Temp: 97.8 F (36.6 C)   SpO2: 99% 100%    GENERAL: No apparent distress.  Nontoxic. HEENT: MMM.  Vision and hearing grossly intact.  NECK: Supple.  No apparent JVD.  RESP: On RA.  No IWOB.  Fair aeration bilaterally. CVS:  RRR. Heart sounds normal.  ABD/GI/GU: Bowel sounds present. Soft. Non tender.  MSK/EXT:  Moves extremities. No apparent deformity. No edema.  SKIN: Healed surgical wound to left elbow.  Stitch in place. NEURO: Awake, alert and oriented appropriately.  No apparent focal neuro deficit. PSYCH: Calm. Normal affect.   Discharge Instructions  Discharge Instructions     Call MD for:  difficulty breathing, headache or visual disturbances   Complete by: As directed    Call MD for:  extreme fatigue   Complete by: As directed    Call MD for:  persistant nausea and vomiting   Complete by: As directed    Call MD for:  severe uncontrolled pain   Complete by: As  directed    Call MD for:  temperature >100.4   Complete by: As directed    Diet - low sodium heart healthy   Complete by: As directed    Discharge instructions   Complete by: As directed    It has been a pleasure taking care of you!  You were hospitalized due to left elbow infection.  You were treated surgically medically, and your symptoms improved.  We are discharging you on more antibiotics to complete treatment course.  We have also started you on  blood pressure medication.  Please review your new medication list and the directions on your medications before you take them.  Follow-up with your orthopedic surgeon as previously planned. You also have a follow up with infectious disease on 08/12/2020   Take care,   Increase activity slowly   Complete by: As directed    No wound care   Complete by: As directed       Allergies as of 08/03/2020       Reactions   Fish Allergy Hives, Itching        Medication List     STOP taking these medications    doxycycline 100 MG capsule Commonly known as: VIBRAMYCIN   terbinafine 1 % cream Commonly known as: LAMISIL   tiZANidine 4 MG tablet Commonly known as: Zanaflex       TAKE these medications    acetaminophen 325 MG tablet Commonly known as: TYLENOL Take 1-2 tablets (325-650 mg total) by mouth every 6 (six) hours as needed for mild pain (pain score 1-3 or temp > 100.5). What changed:  medication strength how much to take reasons to take this   amLODipine 5 MG tablet Commonly known as: NORVASC Take 1 tablet (5 mg total) by mouth daily.   amoxicillin 500 MG capsule Commonly known as: AMOXIL Take 1 capsule (500 mg total) by mouth 3 (three) times daily for 14 days.        Consultations: Orthopedic surgery  Procedures/Studies: 7/19-incision and debridement of left elbow by Dr. Flossie Dibble Chest 2 View  Result Date: 07/24/2020 CLINICAL DATA:  Right-sided chest pain. EXAM: CHEST - 2 VIEW COMPARISON:  Radiograph 07/20/2020 FINDINGS: Right upper extremity PICC tip is in the lower SVC. There is no pneumothorax. Trace blunting of the right costophrenic angle with small effusion. This is new from prior exam. No focal airspace disease. The heart is normal in size with normal mediastinal contours. No pulmonary edema. No acute osseous abnormalities are seen IMPRESSION: 1. Trace right pleural effusion, new from prior. 2. Right upper extremity PICC with tip in the lower SVC.  No pneumothorax. Electronically Signed   By: Narda Rutherford M.D.   On: 07/24/2020 14:28   DG Chest 2 View  Result Date: 07/20/2020 CLINICAL DATA:  Rib pain and fever. Patient reports right flank pain after injury at work recently. EXAM: CHEST - 2 VIEW COMPARISON:  Chest radiograph 07/10/2020 FINDINGS: The cardiomediastinal contours are normal. The lungs are clear. Pulmonary vasculature is normal. No consolidation, pleural effusion, or pneumothorax. No acute osseous abnormalities are seen. No visualized rib fractures on this non dedicated rib exam. IMPRESSION: Negative radiographs of the chest. Electronically Signed   By: Narda Rutherford M.D.   On: 07/20/2020 23:44   DG Chest 2 View  Result Date: 07/10/2020 CLINICAL DATA:  Shortness of breath. EXAM: CHEST - 2 VIEW COMPARISON:  None. FINDINGS: The heart size and mediastinal contours are within normal limits. Both lungs are  clear. The visualized skeletal structures are unremarkable. IMPRESSION: No active cardiopulmonary disease. Electronically Signed   By: Lupita Raider M.D.   On: 07/10/2020 13:25   DG Elbow Complete Left  Result Date: 07/21/2020 CLINICAL DATA:  Severe elbow pain EXAM: LEFT ELBOW - COMPLETE 3+ VIEW COMPARISON:  None. FINDINGS: No fracture or dislocation is seen. The joint spaces are preserved. Visualized soft tissues are within normal limits. No displaced elbow joint fat pads to suggest an elbow joint effusion. IMPRESSION: Negative. Electronically Signed   By: Charline Bills M.D.   On: 07/21/2020 02:42   [place] EKG SITE RITE  Result Date: 07/22/2020 If Site Rite image not attached, placement could not be confirmed due to current cardiac rhythm.      The results of significant diagnostics from this hospitalization (including imaging, microbiology, ancillary and laboratory) are listed below for reference.     Microbiology: No results found for this or any previous visit (from the past 240 hour(s)).   Labs:  CBC: Recent  Labs  Lab 07/31/20 0440  WBC 8.7  HGB 13.9  HCT 41.9  MCV 96.8  PLT 333   BMP &GFR Recent Labs  Lab 07/31/20 0440  NA 139  K 4.2  CL 101  CO2 28  GLUCOSE 91  BUN 17  CREATININE 0.77  CALCIUM 9.4   Estimated Creatinine Clearance: 113.2 mL/min (by C-G formula based on SCr of 0.77 mg/dL). Liver & Pancreas: Recent Labs  Lab 07/31/20 0440 08/01/20 0333 08/02/20 0310  AST 104* 88* 87*  ALT 175* 165* 171*  ALKPHOS 85 77 84  BILITOT 0.5 0.9 0.9  PROT 7.4 7.1 7.3  ALBUMIN 3.7 3.5 3.6   No results for input(s): LIPASE, AMYLASE in the last 168 hours. No results for input(s): AMMONIA in the last 168 hours. Diabetic: No results for input(s): HGBA1C in the last 72 hours. No results for input(s): GLUCAP in the last 168 hours. Cardiac Enzymes: No results for input(s): CKTOTAL, CKMB, CKMBINDEX, TROPONINI in the last 168 hours. No results for input(s): PROBNP in the last 8760 hours. Coagulation Profile: No results for input(s): INR, PROTIME in the last 168 hours. Thyroid Function Tests: No results for input(s): TSH, T4TOTAL, FREET4, T3FREE, THYROIDAB in the last 72 hours. Lipid Profile: No results for input(s): CHOL, HDL, LDLCALC, TRIG, CHOLHDL, LDLDIRECT in the last 72 hours. Anemia Panel: No results for input(s): VITAMINB12, FOLATE, FERRITIN, TIBC, IRON, RETICCTPCT in the last 72 hours. Urine analysis:    Component Value Date/Time   COLORURINE YELLOW 07/21/2020 0220   APPEARANCEUR HAZY (A) 07/21/2020 0220   LABSPEC 1.028 07/21/2020 0220   PHURINE 5.0 07/21/2020 0220   GLUCOSEU NEGATIVE 07/21/2020 0220   HGBUR SMALL (A) 07/21/2020 0220   BILIRUBINUR NEGATIVE 07/21/2020 0220   BILIRUBINUR negative 10/07/2019 1254   KETONESUR 20 (A) 07/21/2020 0220   PROTEINUR 100 (A) 07/21/2020 0220   UROBILINOGEN 0.2 10/07/2019 1254   NITRITE NEGATIVE 07/21/2020 0220   LEUKOCYTESUR NEGATIVE 07/21/2020 0220   Sepsis Labs: Invalid input(s): PROCALCITONIN, LACTICIDVEN   Time  coordinating discharge: 35 minutes  SIGNED:  Almon Hercules, MD  [facility] 08/03/2020, 10:01 PM  If 7PM-7AM, please contact night-coverage [email or web address]

## 2020-08-03 NOTE — Evaluation (Signed)
Occupational Therapy Screen Patient Details Name: MAHONRI SEIDEN MRN: [id number] DOB: Aug 30, 1985 Today's Date: 08/03/2020    History of Present Illness Patient is a 35 year old male s/p I and D of L elbow due to Septic joint 2* group A strep. no listed PMH in chart   Clinical Impression   OT order received and clarified with help from Dr. Alanda Slim which is appreciated.  Pt requesting LT wrist brace for stability when returning to work "tomorrow" and "stress ball" for LT hand strengthening.  Pt educated that orders from Orthopaedic have not yet cleared pt for resistance or strengthening.  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  Pt advised to wait for his follow up with Ortho and then ask for release to strengthen and to follow his Surgeon's recommendations. Pt cautioned regarding beginning a strengthening program too soon and possible complications. Pt agreeable to this and agreed to wait until his f/u appointment with Dr. Shon Baton or Leotis Shames.  Pt without further needs related to OT, anticipates d/c today and okay for home from OT standpoint.  Will defer return to work clearance to surgery.     Follow Up Recommendations    : Per Ortho recommendations.     Victorino Dike, OT Acute Rehab Services Office: [phone] 08/03/2020  Theodoro Clock 08/03/2020, 11:58 AM

## 2020-08-12 ENCOUNTER — Ambulatory Visit: Payer: Self-pay | Admitting: Internal Medicine

## 2020-08-18 ENCOUNTER — Ambulatory Visit: Payer: Self-pay | Admitting: Internal Medicine

## 2021-12-17 ENCOUNTER — Other Ambulatory Visit (HOSPITAL_COMMUNITY): Payer: Self-pay

## 2022-01-07 ENCOUNTER — Other Ambulatory Visit (HOSPITAL_COMMUNITY): Payer: Self-pay

## 2023-04-05 IMAGING — CR DG ELBOW COMPLETE 3+V*L*
4 series · 4 of 4 positions shown · non-contrast
Comparison: None.

CLINICAL DATA: Severe elbow pain

EXAM:
LEFT ELBOW - COMPLETE 3+ VIEW

[x elbow obl left (1 of 2)]
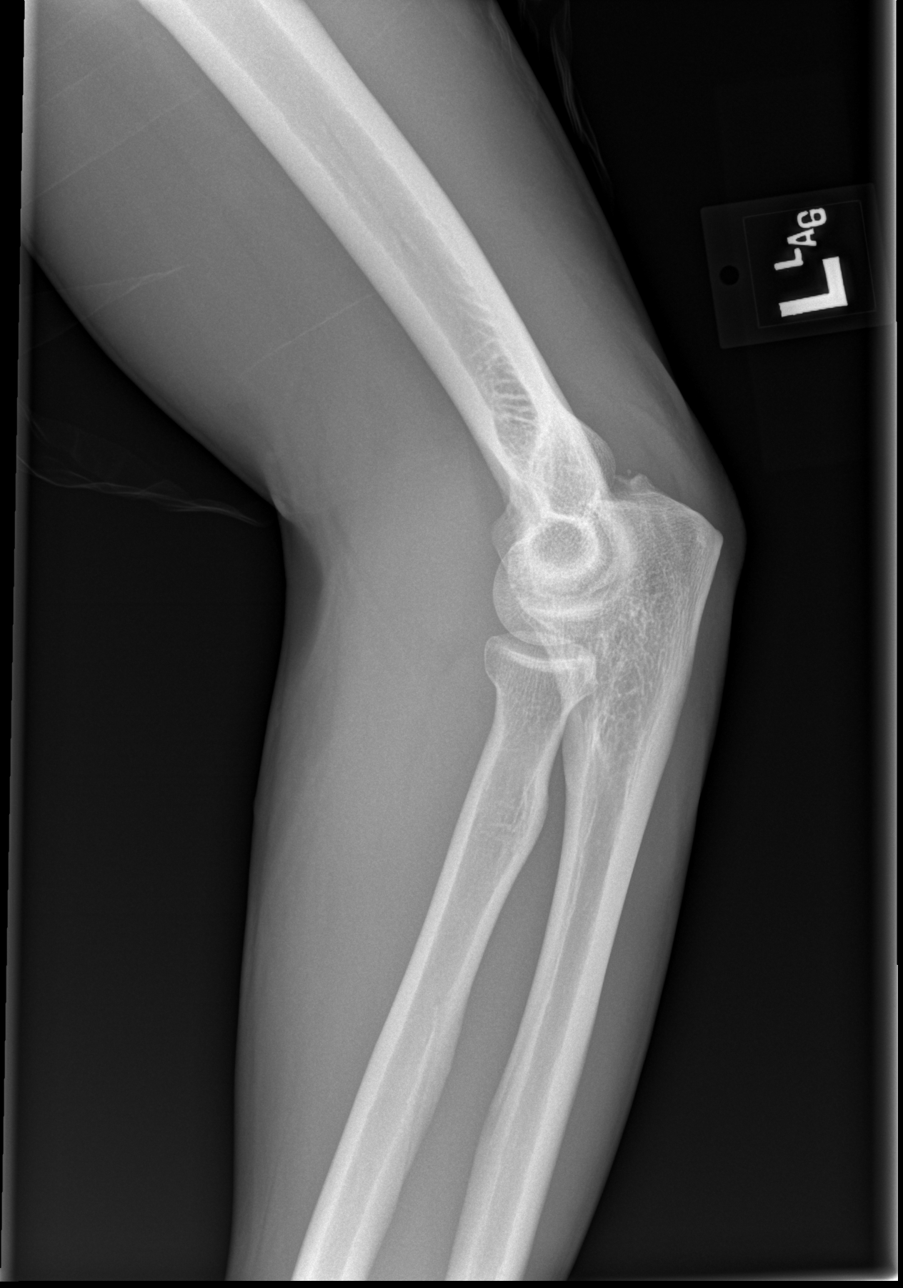

[x elbow lat left]
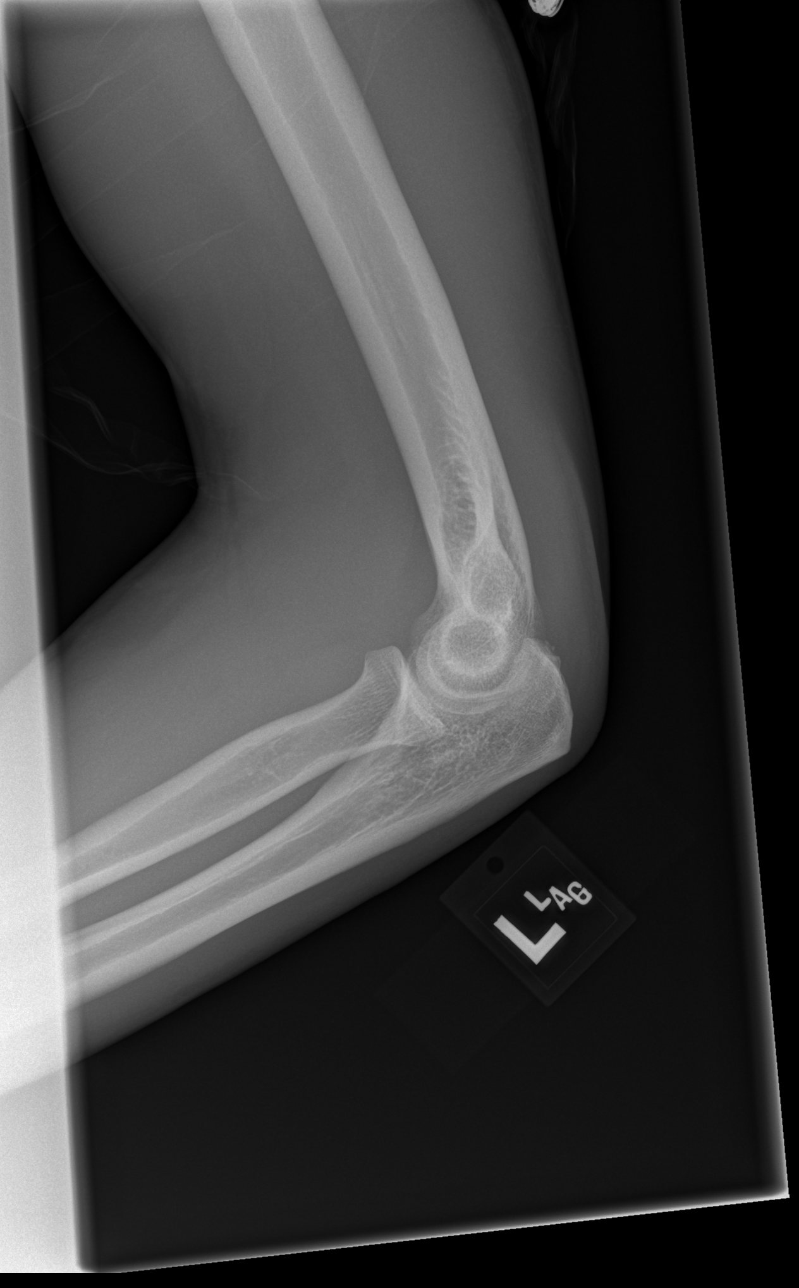

[x elbow ap left]
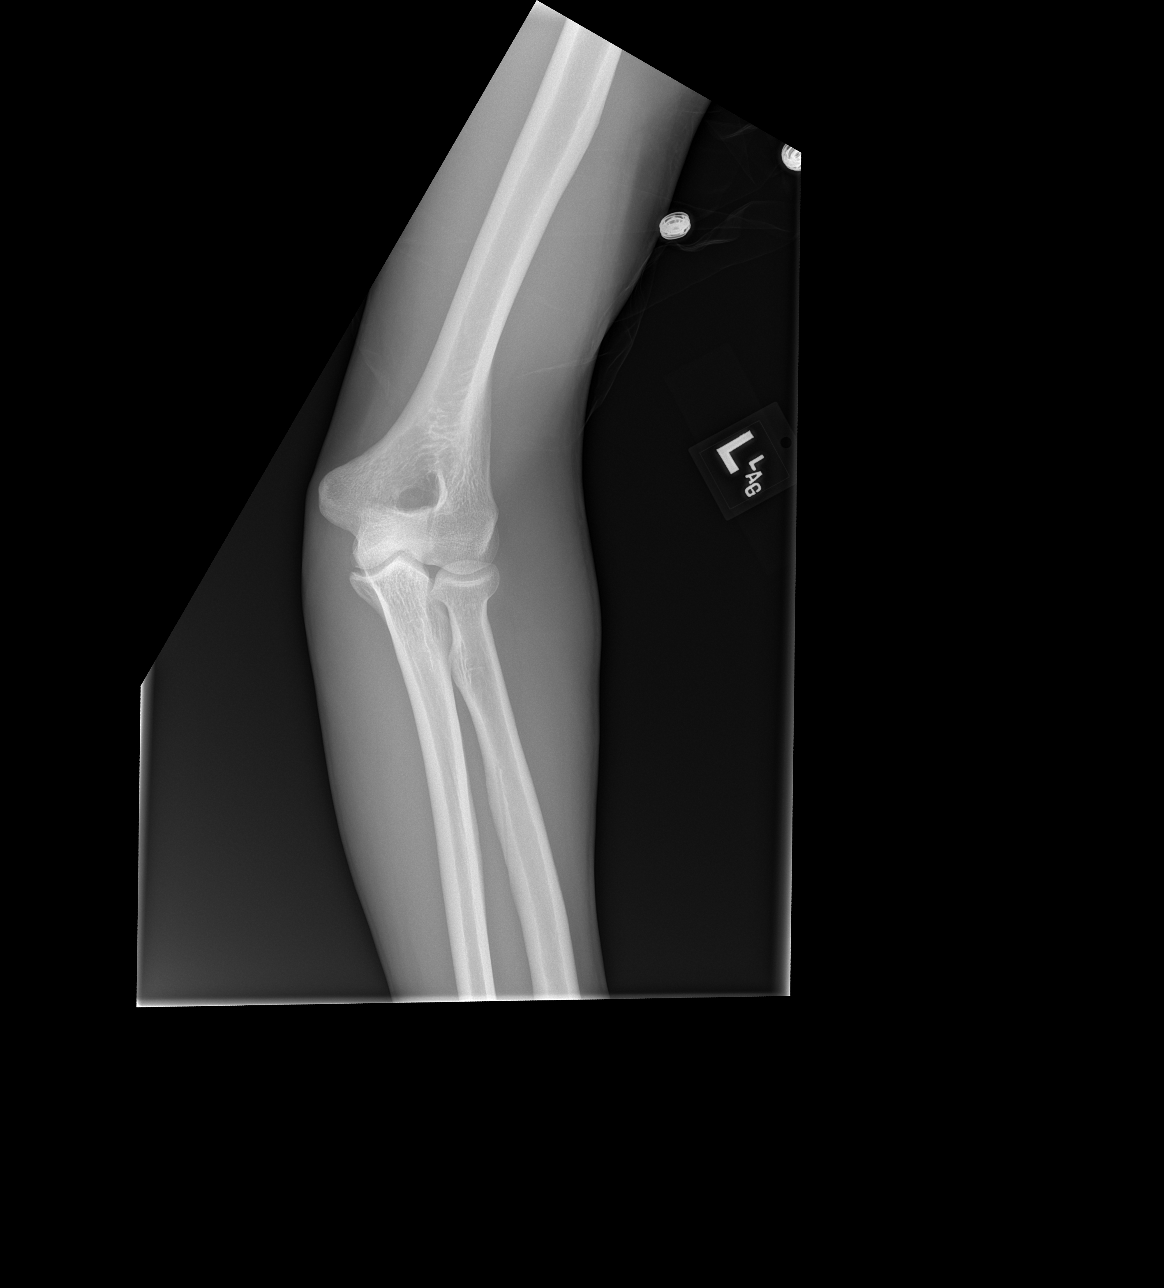

[x elbow obl left (2 of 2)]
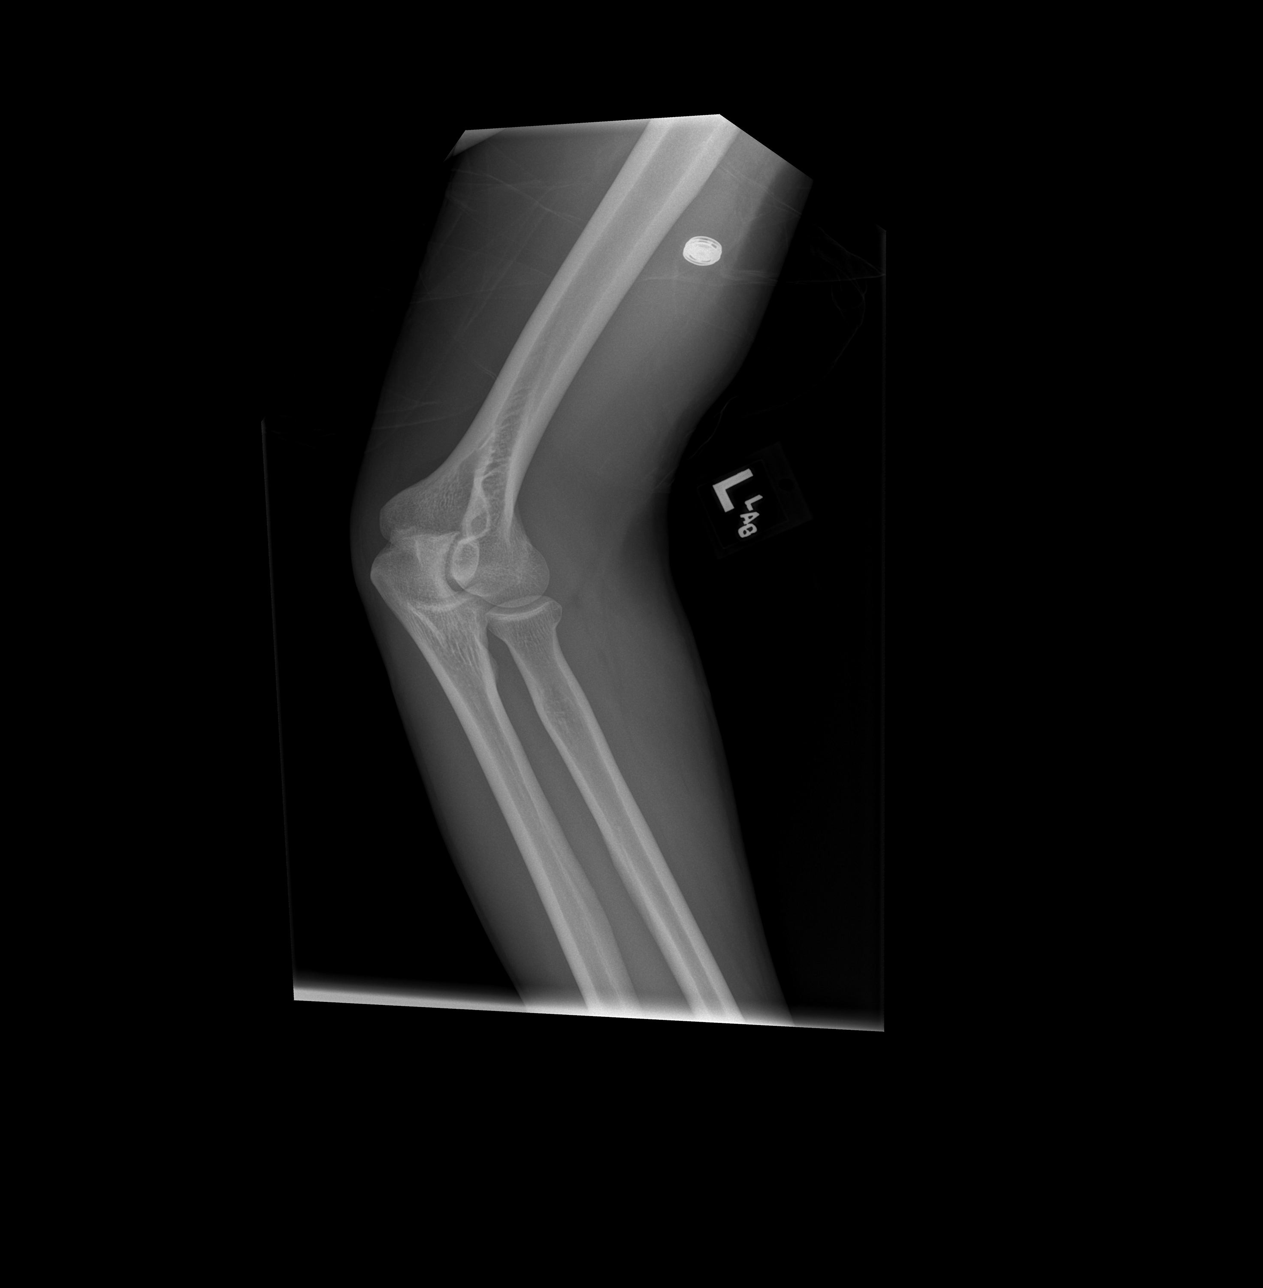

[4 of 4 positions shown; findings below may reference images not displayed]

FINDINGS: No fracture or dislocation is seen.

The joint spaces are preserved.

Visualized soft tissues are within normal limits.

No displaced elbow joint fat pads to suggest an elbow joint
effusion.
IMPRESSION: Negative.

## 2023-04-08 IMAGING — DX DG CHEST 2V
2 series · 2 of 2 positions shown · non-contrast
Comparison: Radiograph 07/20/2020

CLINICAL DATA: Right-sided chest pain.

EXAM:
CHEST - 2 VIEW

[chest pa]
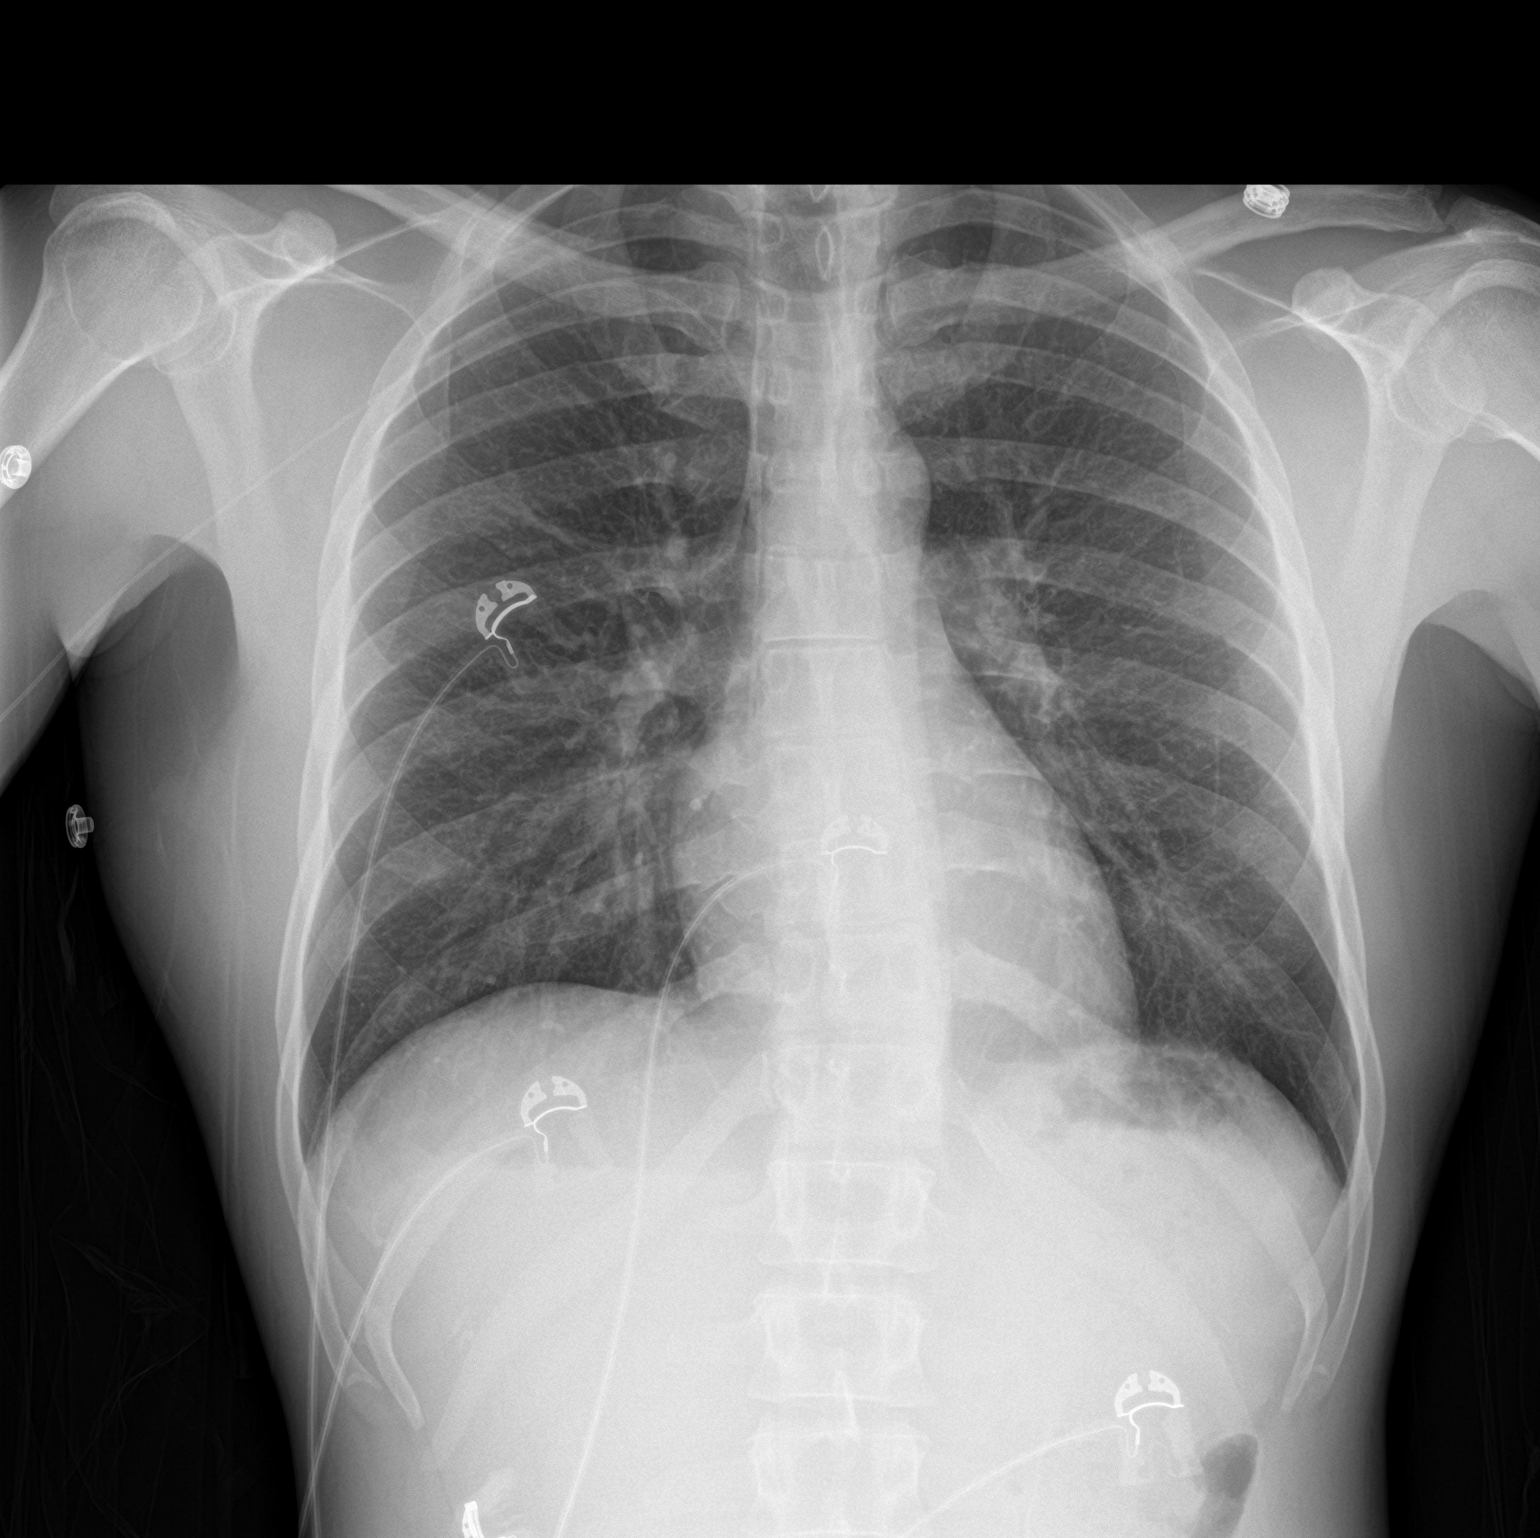

[chest lat]
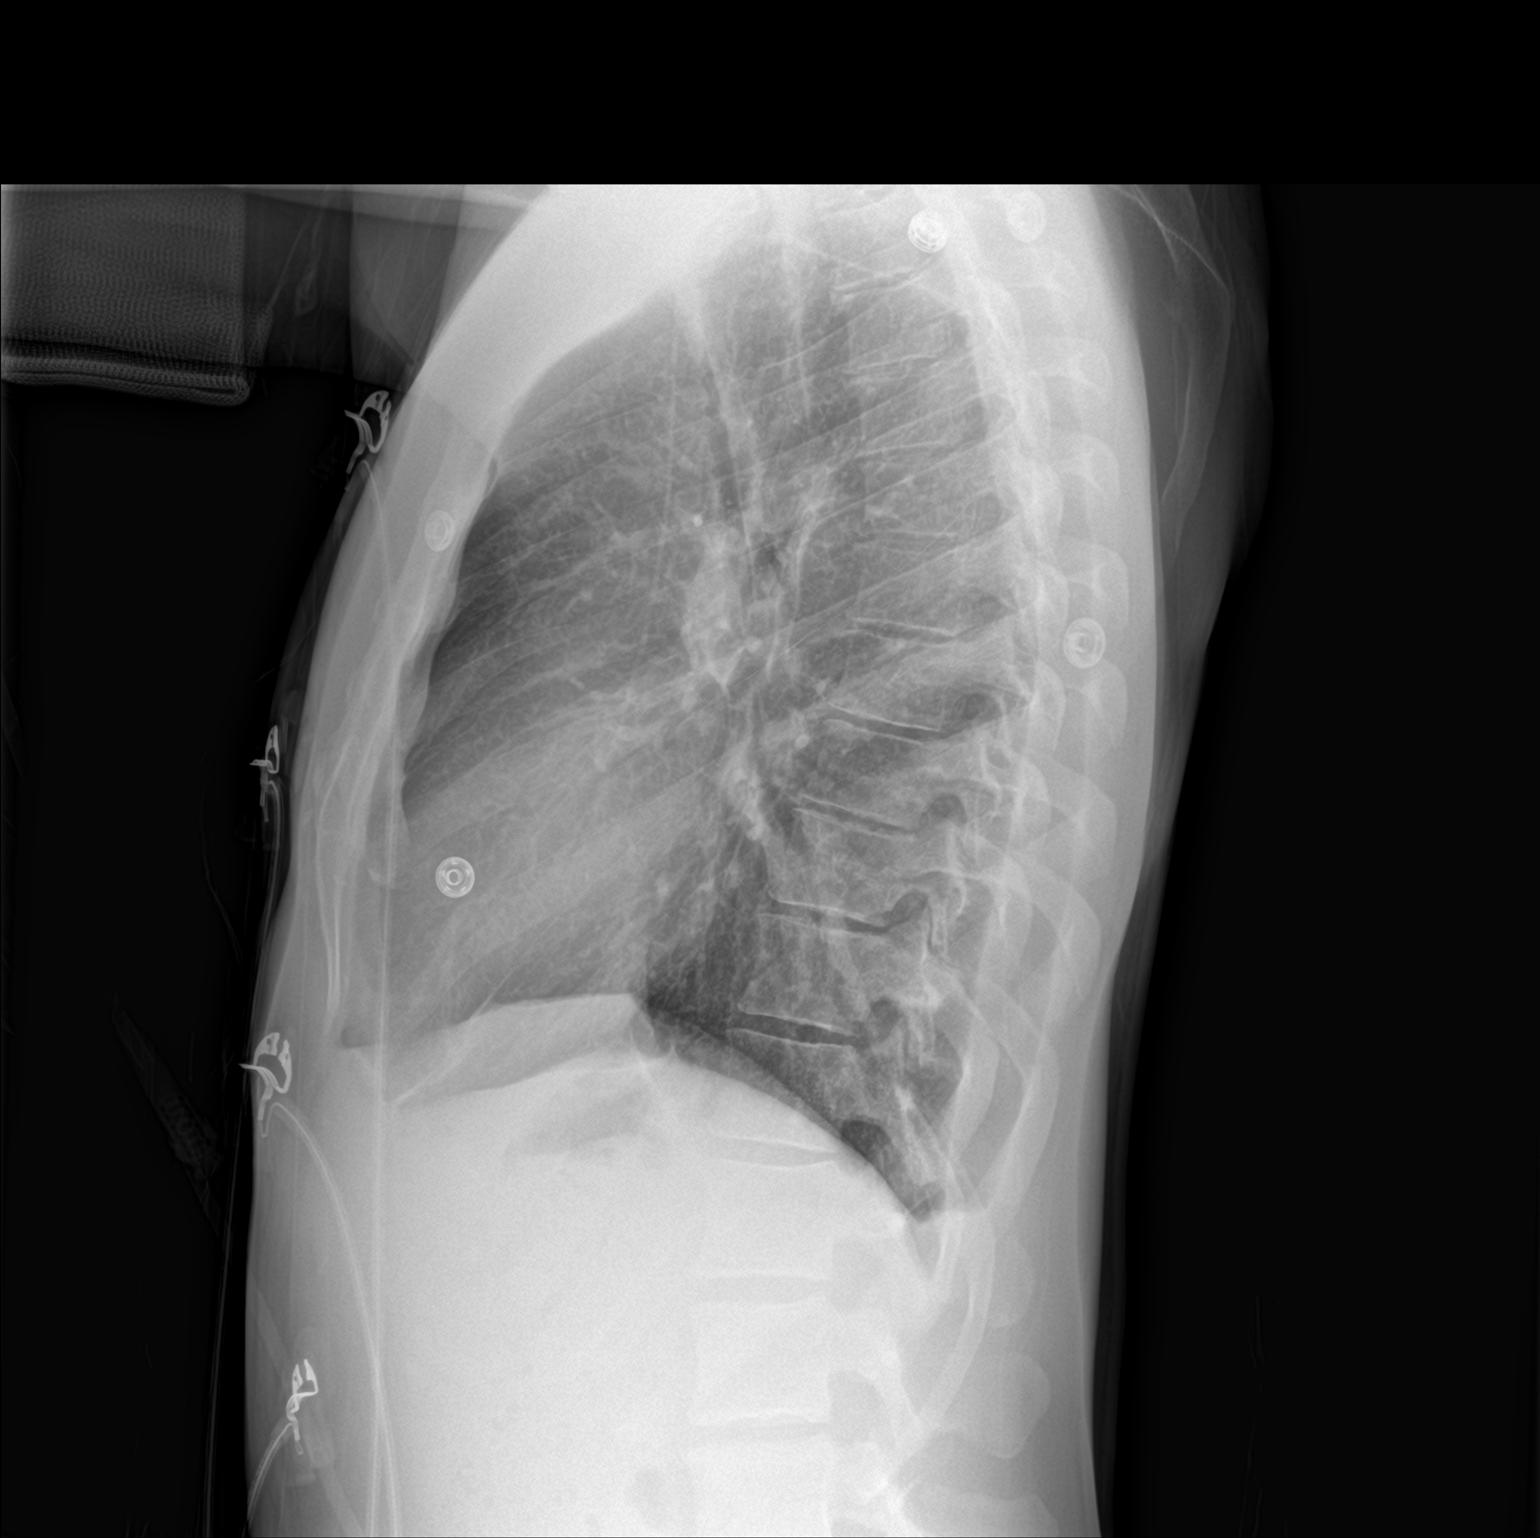

[2 of 2 positions shown; findings below may reference images not displayed]

FINDINGS: Right upper extremity PICC tip is in the lower SVC. There is no
pneumothorax. Trace blunting of the right costophrenic angle with
small effusion. This is new from prior exam. No focal airspace
disease. The heart is normal in size with normal mediastinal
contours. No pulmonary edema. No acute osseous abnormalities are
seen
IMPRESSION: 1. Trace right pleural effusion, new from prior.
2. Right upper extremity PICC with tip in the lower SVC. No
pneumothorax.
